# Patient Record
Sex: Female | Born: 1995 | Race: Black or African American | Hispanic: No | Marital: Single | Smoking: Current every day smoker
Health system: Southern US, Community
[De-identification: ages and names within clinical notes are randomized; demographics above are authoritative.]

## PROBLEM LIST (undated history)

## (undated) DIAGNOSIS — F32A Depression, unspecified: Secondary | ICD-10-CM

## (undated) DIAGNOSIS — R51 Headache: Secondary | ICD-10-CM

## (undated) DIAGNOSIS — T7840XA Allergy, unspecified, initial encounter: Secondary | ICD-10-CM

## (undated) DIAGNOSIS — H539 Unspecified visual disturbance: Secondary | ICD-10-CM

## (undated) DIAGNOSIS — J45909 Unspecified asthma, uncomplicated: Secondary | ICD-10-CM

## (undated) DIAGNOSIS — F4310 Post-traumatic stress disorder, unspecified: Secondary | ICD-10-CM

## (undated) DIAGNOSIS — F329 Major depressive disorder, single episode, unspecified: Secondary | ICD-10-CM

## (undated) DIAGNOSIS — R569 Unspecified convulsions: Secondary | ICD-10-CM

---
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
## 2016-01-10 ENCOUNTER — Emergency Department (HOSPITAL_BASED_OUTPATIENT_CLINIC_OR_DEPARTMENT_OTHER)
Admission: EM | Admit: 2016-01-10 | Discharge: 2016-01-10 | Disposition: A | Discharge status: Home / Self Care | Payer: Self-pay | Attending: Emergency Medicine | Admitting: Emergency Medicine

## 2016-01-10 ENCOUNTER — Emergency Department (HOSPITAL_BASED_OUTPATIENT_CLINIC_OR_DEPARTMENT_OTHER): Payer: Self-pay

## 2016-01-10 ENCOUNTER — Encounter (HOSPITAL_BASED_OUTPATIENT_CLINIC_OR_DEPARTMENT_OTHER): Payer: Self-pay | Admitting: Emergency Medicine

## 2016-01-10 DIAGNOSIS — K2970 Gastritis, unspecified, without bleeding: Secondary | ICD-10-CM | POA: Insufficient documentation

## 2016-01-10 DIAGNOSIS — R55 Syncope and collapse: Secondary | ICD-10-CM | POA: Insufficient documentation

## 2016-01-10 DIAGNOSIS — R42 Dizziness and giddiness: Secondary | ICD-10-CM | POA: Insufficient documentation

## 2016-01-10 DIAGNOSIS — M549 Dorsalgia, unspecified: Secondary | ICD-10-CM | POA: Insufficient documentation

## 2016-01-10 DIAGNOSIS — M542 Cervicalgia: Secondary | ICD-10-CM | POA: Insufficient documentation

## 2016-01-10 LAB — COMPREHENSIVE METABOLIC PANEL
ALT: 7 U/L — AB (ref 14–54)
AST: 13 U/L — AB (ref 15–41)
Albumin: 4.5 g/dL (ref 3.5–5.0)
Alkaline Phosphatase: 46 U/L (ref 38–126)
Anion gap: 9 (ref 5–15)
BUN: 14 mg/dL (ref 6–20)
CHLORIDE: 106 mmol/L (ref 101–111)
CO2: 23 mmol/L (ref 22–32)
Calcium: 9.2 mg/dL (ref 8.9–10.3)
Creatinine, Ser: 0.7 mg/dL (ref 0.44–1.00)
GFR calc non Af Amer: >60 mL/min (ref >60)
Glucose, Bld: 85 mg/dL (ref 65–99)
POTASSIUM: 3.6 mmol/L (ref 3.5–5.1)
Sodium: 138 mmol/L (ref 135–145)
Total Bilirubin: 0.6 mg/dL (ref 0.3–1.2)
Total Protein: 7.7 g/dL (ref 6.5–8.1)

## 2016-01-10 LAB — CBC WITH DIFFERENTIAL/PLATELET
Basophils Absolute: 0 10*3/uL (ref 0.0–0.1)
Basophils Relative: 0 %
EOS ABS: 0.1 10*3/uL (ref 0.0–0.7)
Eosinophils Relative: 1 %
HEMATOCRIT: 37.9 % (ref 36.0–46.0)
HEMOGLOBIN: 12.9 g/dL (ref 12.0–15.0)
LYMPHS ABS: 1.4 10*3/uL (ref 0.7–4.0)
LYMPHS PCT: 25 %
MCH: 31.2 pg (ref 26.0–34.0)
MCHC: 34 g/dL (ref 30.0–36.0)
MCV: 91.8 fL (ref 78.0–100.0)
MONOS PCT: 12 %
Monocytes Absolute: 0.7 10*3/uL (ref 0.1–1.0)
NEUTROS PCT: 62 %
Neutro Abs: 3.5 10*3/uL (ref 1.7–7.7)
Platelets: 201 10*3/uL (ref 150–400)
RBC: 4.13 MIL/uL (ref 3.87–5.11)
RDW: 13.3 % (ref 11.5–15.5)
WBC: 5.6 10*3/uL (ref 4.0–10.5)

## 2016-01-10 LAB — URINALYSIS, ROUTINE W REFLEX MICROSCOPIC
Glucose, UA: NEGATIVE mg/dL
Hgb urine dipstick: NEGATIVE
KETONES UR: NEGATIVE mg/dL
LEUKOCYTES UA: NEGATIVE
Nitrite: NEGATIVE
PROTEIN: 30 mg/dL — AB
Specific Gravity, Urine: 1.03 (ref 1.005–1.030)
pH: 6 (ref 5.0–8.0)

## 2016-01-10 LAB — URINE MICROSCOPIC-ADD ON

## 2016-01-10 LAB — PREGNANCY, URINE: PREG TEST UR: NEGATIVE

## 2016-01-10 MED ORDER — FAMOTIDINE IN NACL 20-0.9 MG/50ML-% IV SOLN
20.0000 mg | Freq: Once | INTRAVENOUS | Status: AC
  Administered 2016-01-10: 20 mg via INTRAVENOUS
  Filled 2016-01-10: qty 50

## 2016-01-10 MED ORDER — ONDANSETRON HCL 4 MG/2ML IJ SOLN
4.0000 mg | Freq: Once | INTRAMUSCULAR | Status: AC
  Administered 2016-01-10: 4 mg via INTRAVENOUS
  Filled 2016-01-10: qty 2

## 2016-01-10 MED ORDER — SODIUM CHLORIDE 0.9 % IV BOLUS (SEPSIS)
1000.0000 mL | Freq: Once | INTRAVENOUS | Status: AC
  Administered 2016-01-10: 1000 mL via INTRAVENOUS

## 2016-01-10 NOTE — ED Notes (Signed)
Patient reports she was brought to ER by brother.  States her friends told her they think she has alcohol poisoning and she can't remember anything.  States that she has vomited this morning.  States that she "blacked out" and states "I have been going in and out this morning".  When asked for clarification patient states she has been going in and out of consciousness.

## 2016-01-10 NOTE — Discharge Instructions (Signed)
Gastritis, Adult Gastritis is soreness and swelling (inflammation) of the lining of the stomach. Gastritis can develop as a sudden onset (acute) or long-term (chronic) condition. If gastritis is not treated, it can lead to stomach bleeding and ulcers. CAUSES  Gastritis occurs when the stomach lining is weak or damaged. Digestive juices from the stomach then inflame the weakened stomach lining. The stomach lining may be weak or damaged due to viral or bacterial infections. One common bacterial infection is the Helicobacter pylori infection. Gastritis can also result from excessive alcohol consumption, taking certain medicines, or having too much acid in the stomach.  SYMPTOMS  In some cases, there are no symptoms. When symptoms are present, they may include:  Pain or a burning sensation in the upper abdomen.  Nausea.  Vomiting.  An uncomfortable feeling of fullness after eating. DIAGNOSIS  Your caregiver may suspect you have gastritis based on your symptoms and a physical exam. To determine the cause of your gastritis, your caregiver may perform the following:  Blood or stool tests to check for the H pylori bacterium.  Gastroscopy. A thin, flexible tube (endoscope) is passed down the esophagus and into the stomach. The endoscope has a light and camera on the end. Your caregiver uses the endoscope to view the inside of the stomach.  Taking a tissue sample (biopsy) from the stomach to examine under a microscope. TREATMENT  Depending on the cause of your gastritis, medicines may be prescribed. If you have a bacterial infection, such as an H pylori infection, antibiotics may be given. If your gastritis is caused by too much acid in the stomach, H2 blockers or antacids may be given. Your caregiver may recommend that you stop taking aspirin, ibuprofen, or other nonsteroidal anti-inflammatory drugs (NSAIDs). HOME CARE INSTRUCTIONS  Only take over-the-counter or prescription medicines as directed by  your caregiver.  If you were given antibiotic medicines, take them as directed. Finish them even if you start to feel better.  Drink enough fluids to keep your urine clear or pale yellow.  Avoid foods and drinks that make your symptoms worse, such as:  Caffeine or alcoholic drinks.  Chocolate.  Peppermint or mint flavorings.  Garlic and onions.  Spicy foods.  Citrus fruits, such as oranges, lemons, or limes.  Tomato-based foods such as sauce, chili, salsa, and pizza.  Fried and fatty foods.  Eat small, frequent meals instead of large meals. SEEK IMMEDIATE MEDICAL CARE IF:   You have black or dark red stools.  You vomit blood or material that looks like coffee grounds.  You are unable to keep fluids down.  Your abdominal pain gets worse.  You have a fever.  You do not feel better after 1 week.  You have any other questions or concerns. MAKE SURE YOU:  Understand these instructions.  Will watch your condition.  Will get help right away if you are not doing well or get worse.   This information is not intended to replace advice given to you by your health care provider. Make sure you discuss any questions you have with your health care provider.   Document Released: 06/28/2001 Document Revised: 01/03/2012 Document Reviewed: 08/17/2011 Elsevier Interactive Patient Education 2016 [employer]  Syncope Syncope is a medical term for fainting or passing out. This means you lose consciousness and drop to the ground. People are generally unconscious for less than 5 minutes. You may have some muscle twitches for up to 15 seconds before waking up and returning to normal. Syncope  occurs more often in older adults, but it can happen to anyone. While most causes of syncope are not dangerous, syncope can be a sign of a serious medical problem. It is important to seek medical care.  CAUSES  Syncope is caused by a sudden drop in blood flow to the brain. The specific cause is  often not determined. Factors that can bring on syncope include:  Taking medicines that lower blood pressure.  Sudden changes in posture, such as standing up quickly.  Taking more medicine than prescribed.  Standing in one place for too long.  Seizure disorders.  Dehydration and excessive exposure to heat.  Low blood sugar (hypoglycemia).  Straining to have a bowel movement.  Heart disease, irregular heartbeat, or other circulatory problems.  Fear, emotional distress, seeing blood, or severe pain. SYMPTOMS  Right before fainting, you may:  Feel dizzy or light-headed.  Feel nauseous.  See all white or all black in your field of vision.  Have cold, clammy skin. DIAGNOSIS  Your health care provider will ask about your symptoms, perform a physical exam, and perform an electrocardiogram (ECG) to record the electrical activity of your heart. Your health care provider may also perform other heart or blood tests to determine the cause of your syncope which may include:  Transthoracic echocardiogram (TTE). During echocardiography, sound waves are used to evaluate how blood flows through your heart.  Transesophageal echocardiogram (TEE).  Cardiac monitoring. This allows your health care provider to monitor your heart rate and rhythm in real time.  Holter monitor. This is a portable device that records your heartbeat and can help diagnose heart arrhythmias. It allows your health care provider to track your heart activity for several days, if needed.  Stress tests by exercise or by giving medicine that makes the heart beat faster. TREATMENT  In most cases, no treatment is needed. Depending on the cause of your syncope, your health care provider may recommend changing or stopping some of your medicines. HOME CARE INSTRUCTIONS  Have someone stay with you until you feel stable.  Do not drive, use machinery, or play sports until your health care provider says it is okay.  Keep all  follow-up appointments as directed by your health care provider.  Lie down right away if you start feeling like you might faint. Breathe deeply and steadily. Wait until all the symptoms have passed.  Drink enough fluids to keep your urine clear or pale yellow.  If you are taking blood pressure or heart medicine, get up slowly and take several minutes to sit and then stand. This can reduce dizziness. SEEK IMMEDIATE MEDICAL CARE IF:   You have a severe headache.  You have unusual pain in the chest, abdomen, or back.  You are bleeding from your mouth or rectum, or you have black or tarry stool.  You have an irregular or very fast heartbeat.  You have pain with breathing.  You have repeated fainting or seizure-like jerking during an episode.  You faint when sitting or lying down.  You have confusion.  You have trouble walking.  You have severe weakness.  You have vision problems. If you fainted, call your local emergency services (911 in [place] Do not drive yourself to the hospital.    This information is not intended to replace advice given to you by your health care provider. Make sure you discuss any questions you have with your health care provider.   Document Released: 07/04/2005 Document Revised: 11/18/2014 Document Reviewed: 09/02/2011 Elsevier  Interactive Patient Education ©2016 Elsevier Inc. ° °

## 2016-01-10 NOTE — ED Notes (Signed)
Pt given gingerale to drink, states she is feeling better.

## 2016-01-10 NOTE — ED Provider Notes (Signed)
CSN: [id number]     Arrival date & time 01/10/16  1148 History   First MD Initiated Contact with Patient 01/10/16 1225     Chief Complaint  Patient presents with  . Emesis     (Consider location/radiation/quality/duration/timing/severity/associated sxs/prior Treatment) HPI Comments: Patient presents with nausea vomiting and syncope. She states that she drink a lot of alcohol last night. She can't remember how much she drink. She states that she's had ongoing vomiting this morning and pain across her abdomen. She states it's sore all over. She states she had a syncope episode earlier this morning. She states she fell and currently has pain in her head and neck as well as her left back. She doesn't know how she fell. She denies any numbness or weakness to her extremities. No other recent illnesses. No fevers. She does have a bad headache. She denies any other injuries from the fall.  Patient is a 20 y.o. female presenting with vomiting.  Emesis Associated symptoms: abdominal pain and headaches   Associated symptoms: no arthralgias, no chills and no diarrhea     Past Medical History  Diagnosis Date  . Vision abnormalities   . Allergy   . [id number](478.2)    Past Surgical History  Procedure Laterality Date  . Breast cyst excision  August 2012   Family History  Problem Relation Age of Onset  . Alcohol abuse Father   . Sudden death Neg Hx   . Hypertension Neg Hx   . Hyperlipidemia Neg Hx   . Heart attack Neg Hx   . Diabetes Neg Hx    Social History  Substance Use Topics  . Smoking status: Never Smoker   . Smokeless tobacco: None  . Alcohol Use: Yes   OB History    No data available     Review of Systems  Constitutional: Positive for fatigue. Negative for fever, chills and diaphoresis.  HENT: Negative for congestion, rhinorrhea and sneezing.   Eyes: Negative.   Respiratory: Negative for cough, chest tightness and shortness of breath.   Cardiovascular: Negative for chest  pain and leg swelling.  Gastrointestinal: Positive for nausea, vomiting and abdominal pain. Negative for diarrhea and blood in stool.  Genitourinary: Negative for frequency, hematuria, flank pain and difficulty urinating.  Musculoskeletal: Positive for back pain and neck pain. Negative for arthralgias.  Skin: Negative for rash.  Neurological: Positive for syncope, light-headedness and headaches. Negative for dizziness, speech difficulty, weakness and numbness.      Allergies  Pollen extract  Home Medications   Prior to Admission medications   Medication Sig Start Date End Date Taking? Authorizing Provider  ondansetron (ZOFRAN) 4 MG tablet Take 1 tablet (4 mg total) by mouth every 6 (six) hours. 09/23/15   Hanna Patel-Mills, PA-C  oxyCODONE-acetaminophen (PERCOCET/ROXICET) 5-325 MG tablet Take 2 tablets by mouth every 4 (four) hours as needed for severe pain. 09/23/15   Hanna Patel-Mills, PA-C   BP 119/80 mmHg  Pulse 75  Temp(Src) 98.5 F (36.9 C) (Oral)  Resp 18  Ht 5\' 9"  (1.753 m)  Wt 137 lb (62.143 kg)  BMI 20.22 kg/m2  SpO2 100%  LMP 12/20/2015 Physical Exam  Constitutional: She is oriented to person, place, and time. She appears well-developed and well-nourished.  HENT:  Head: Normocephalic and atraumatic.  Eyes: Pupils are equal, round, and reactive to light.  Neck:  Positive tenderness throughout the cervical spine. There is no pain to the thoracic or lumbosacral spine. There is tenderness to the left lumbar  musculature. No evidence of external trauma is noted.  Cardiovascular: Normal rate, regular rhythm and normal heart sounds.   Pulmonary/Chest: Effort normal and breath sounds normal. No respiratory distress. She has no wheezes. She has no rales. She exhibits no tenderness.  Abdominal: Soft. Bowel sounds are normal. There is no tenderness. There is no rebound and no guarding.  Musculoskeletal: Normal range of motion. She exhibits no edema.  No pain on palpation or range  of motion extremities  Lymphadenopathy:    She has no cervical adenopathy.  Neurological: She is alert and oriented to person, place, and time. She has normal strength. No cranial nerve deficit or sensory deficit. GCS eye subscore is 4. GCS verbal subscore is 5. GCS motor subscore is 6.  Skin: Skin is warm and dry. No rash noted.  Psychiatric: She has a normal mood and affect.    ED Course  Procedures (including critical care time) Labs Review Results for orders placed or performed during the hospital encounter of 01/10/16  Pregnancy, urine  Result Value Ref Range   Preg Test, Ur NEGATIVE NEGATIVE  Urinalysis, Routine w reflex microscopic (not at [facility])  Result Value Ref Range   Color, Urine AMBER (A) YELLOW   APPearance CLOUDY (A) CLEAR   Specific Gravity, Urine 1.030 1.005 - 1.030   pH 6.0 5.0 - 8.0   Glucose, UA NEGATIVE NEGATIVE mg/dL   Hgb urine dipstick NEGATIVE NEGATIVE   Bilirubin Urine SMALL (A) NEGATIVE   Ketones, ur NEGATIVE NEGATIVE mg/dL   Protein, ur 30 (A) NEGATIVE mg/dL   Nitrite NEGATIVE NEGATIVE   Leukocytes, UA NEGATIVE NEGATIVE  Comprehensive metabolic panel  Result Value Ref Range   Sodium 138 135 - 145 mmol/L   Potassium 3.6 3.5 - 5.1 mmol/L   Chloride 106 101 - 111 mmol/L   CO2 23 22 - 32 mmol/L   Glucose, Bld 85 65 - 99 mg/dL   BUN 14 6 - 20 mg/dL   Creatinine, Ser 1.61 0.44 - 1.00 mg/dL   Calcium 9.2 8.9 - 09.6 mg/dL   Total Protein 7.7 6.5 - 8.1 g/dL   Albumin 4.5 3.5 - 5.0 g/dL   AST 13 (L) 15 - 41 U/L   ALT 7 (L) 14 - 54 U/L   Alkaline Phosphatase 46 38 - 126 U/L   Total Bilirubin 0.6 0.3 - 1.2 mg/dL   GFR calc non Af Amer >60 >60 mL/min   GFR calc Af Amer >60 >60 mL/min   Anion gap 9 5 - 15  CBC with Differential  Result Value Ref Range   WBC 5.6 4.0 - 10.5 K/uL   RBC 4.13 3.87 - 5.11 MIL/uL   Hemoglobin 12.9 12.0 - 15.0 g/dL   HCT 04.5 40.9 - 81.1 %   MCV 91.8 78.0 - 100.0 fL   MCH 31.2 26.0 - 34.0 pg   MCHC 34.0 30.0 - 36.0 g/dL    RDW 91.4 78.2 - 95.6 %   Platelets 201 150 - 400 K/uL   Neutrophils Relative % 62 %   Neutro Abs 3.5 1.7 - 7.7 K/uL   Lymphocytes Relative 25 %   Lymphs Abs 1.4 0.7 - 4.0 K/uL   Monocytes Relative 12 %   Monocytes Absolute 0.7 0.1 - 1.0 K/uL   Eosinophils Relative 1 %   Eosinophils Absolute 0.1 0.0 - 0.7 K/uL   Basophils Relative 0 %   Basophils Absolute 0.0 0.0 - 0.1 K/uL  Urine microscopic-add on  Result Value Ref Range  Squamous Epithelial / LPF 6-30 (A) NONE SEEN   WBC, UA 0-5 0 - 5 WBC/hpf   RBC / HPF 0-5 0 - 5 RBC/hpf   Bacteria, UA MANY (A) NONE SEEN   Urine-Other MUCOUS PRESENT    Ct Head Wo Contrast  01/10/2016  CLINICAL DATA:  Syncopal episode today. Fall. Headache and neck pain. Initial encounter. EXAM: CT HEAD WITHOUT CONTRAST CT CERVICAL SPINE WITHOUT CONTRAST TECHNIQUE: Multidetector CT imaging of the head and cervical spine was performed following the standard protocol without intravenous contrast. Multiplanar CT image reconstructions of the cervical spine were also generated. COMPARISON:  None. FINDINGS: CT HEAD FINDINGS The ventricles and sulci are within normal limits for age. There is no evidence of acute infarct, intracranial hemorrhage, mass, midline shift, or extra-axial collection. The orbits are unremarkable. The visualized paranasal sinuses and mastoid air cells are clear. No skull fracture is identified. CT CERVICAL SPINE FINDINGS Vertebral alignment is normal. Vertebral body heights and intervertebral disc space heights are preserved. No cervical spine fracture is identified. The visualized lung apices are clear. Paraspinal soft tissues are unremarkable. There is a small amounts of venous gas in the right supraclavicular region which is likely related to recent venipuncture. IMPRESSION: Negative head and cervical spine CTs. Electronically Signed   By: Sebastian AcheAllen  Grady M.D.   On: 01/10/2016 14:02   Ct Cervical Spine Wo Contrast  01/10/2016  CLINICAL DATA:  Syncopal  episode today. Fall. Headache and neck pain. Initial encounter. EXAM: CT HEAD WITHOUT CONTRAST CT CERVICAL SPINE WITHOUT CONTRAST TECHNIQUE: Multidetector CT imaging of the head and cervical spine was performed following the standard protocol without intravenous contrast. Multiplanar CT image reconstructions of the cervical spine were also generated. COMPARISON:  None. FINDINGS: CT HEAD FINDINGS The ventricles and sulci are within normal limits for age. There is no evidence of acute infarct, intracranial hemorrhage, mass, midline shift, or extra-axial collection. The orbits are unremarkable. The visualized paranasal sinuses and mastoid air cells are clear. No skull fracture is identified. CT CERVICAL SPINE FINDINGS Vertebral alignment is normal. Vertebral body heights and intervertebral disc space heights are preserved. No cervical spine fracture is identified. The visualized lung apices are clear. Paraspinal soft tissues are unremarkable. There is a small amounts of venous gas in the right supraclavicular region which is likely related to recent venipuncture. IMPRESSION: Negative head and cervical spine CTs. Electronically Signed   By: Sebastian AcheAllen  Grady M.D.   On: 01/10/2016 14:02      Imaging Review Ct Head Wo Contrast  01/10/2016  CLINICAL DATA:  Syncopal episode today. Fall. Headache and neck pain. Initial encounter. EXAM: CT HEAD WITHOUT CONTRAST CT CERVICAL SPINE WITHOUT CONTRAST TECHNIQUE: Multidetector CT imaging of the head and cervical spine was performed following the standard protocol without intravenous contrast. Multiplanar CT image reconstructions of the cervical spine were also generated. COMPARISON:  None. FINDINGS: CT HEAD FINDINGS The ventricles and sulci are within normal limits for age. There is no evidence of acute infarct, intracranial hemorrhage, mass, midline shift, or extra-axial collection. The orbits are unremarkable. The visualized paranasal sinuses and mastoid air cells are clear. No  skull fracture is identified. CT CERVICAL SPINE FINDINGS Vertebral alignment is normal. Vertebral body heights and intervertebral disc space heights are preserved. No cervical spine fracture is identified. The visualized lung apices are clear. Paraspinal soft tissues are unremarkable. There is a small amounts of venous gas in the right supraclavicular region which is likely related to recent venipuncture. IMPRESSION: Negative head and cervical  spine CTs. Electronically Signed   By: Sebastian AcheAllen  Grady M.D.   On: 01/10/2016 14:02   Ct Cervical Spine Wo Contrast  01/10/2016  CLINICAL DATA:  Syncopal episode today. Fall. Headache and neck pain. Initial encounter. EXAM: CT HEAD WITHOUT CONTRAST CT CERVICAL SPINE WITHOUT CONTRAST TECHNIQUE: Multidetector CT imaging of the head and cervical spine was performed following the standard protocol without intravenous contrast. Multiplanar CT image reconstructions of the cervical spine were also generated. COMPARISON:  None. FINDINGS: CT HEAD FINDINGS The ventricles and sulci are within normal limits for age. There is no evidence of acute infarct, intracranial hemorrhage, mass, midline shift, or extra-axial collection. The orbits are unremarkable. The visualized paranasal sinuses and mastoid air cells are clear. No skull fracture is identified. CT CERVICAL SPINE FINDINGS Vertebral alignment is normal. Vertebral body heights and intervertebral disc space heights are preserved. No cervical spine fracture is identified. The visualized lung apices are clear. Paraspinal soft tissues are unremarkable. There is a small amounts of venous gas in the right supraclavicular region which is likely related to recent venipuncture. IMPRESSION: Negative head and cervical spine CTs. Electronically Signed   By: Sebastian AcheAllen  Grady M.D.   On: 01/10/2016 14:02   I have personally reviewed and evaluated these images and lab results as part of my medical decision-making.   EKG Interpretation None       MDM   Final diagnoses:  Gastritis  Syncope, unspecified syncope type    Patient presents after syncopal episode. She has no evidence of head trauma or cervical spine injury. She's neurologically intact. There is no reported seizures. She was given IV fluids and antibiotics. She's feeling much better after this. She's tolerating by mouth fluids. She's able to ambulate without dizziness. I feel her symptoms are likely related to the alcohol use last night. She was discharged home in good condition. She was advised to refrain from alcohol use and return here as needed for any worsening symptoms.    Rolan BuccoMelanie Jonda Alanis, MD 01/10/16 [phone]

## 2016-01-10 NOTE — ED Notes (Signed)
Pt given d/c instructions as per chart. Verbalizes understanding. No questions. 

## 2016-01-10 NOTE — ED Notes (Signed)
Patient reports that she is having vomiting and abdominal pain since last night.

## 2016-03-12 ENCOUNTER — Emergency Department (HOSPITAL_BASED_OUTPATIENT_CLINIC_OR_DEPARTMENT_OTHER): Payer: Self-pay

## 2016-03-12 ENCOUNTER — Encounter (HOSPITAL_BASED_OUTPATIENT_CLINIC_OR_DEPARTMENT_OTHER): Payer: Self-pay | Admitting: *Deleted

## 2016-03-12 ENCOUNTER — Emergency Department (HOSPITAL_BASED_OUTPATIENT_CLINIC_OR_DEPARTMENT_OTHER)
Admission: EM | Admit: 2016-03-12 | Discharge: 2016-03-12 | Disposition: A | Discharge status: Home / Self Care | Payer: Self-pay | Attending: Emergency Medicine | Admitting: Emergency Medicine

## 2016-03-12 DIAGNOSIS — Y929 Unspecified place or not applicable: Secondary | ICD-10-CM | POA: Insufficient documentation

## 2016-03-12 DIAGNOSIS — Y999 Unspecified external cause status: Secondary | ICD-10-CM | POA: Insufficient documentation

## 2016-03-12 DIAGNOSIS — W2201XA Walked into wall, initial encounter: Secondary | ICD-10-CM | POA: Insufficient documentation

## 2016-03-12 DIAGNOSIS — S60222A Contusion of left hand, initial encounter: Secondary | ICD-10-CM | POA: Insufficient documentation

## 2016-03-12 DIAGNOSIS — Y939 Activity, unspecified: Secondary | ICD-10-CM | POA: Insufficient documentation
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
## 2016-03-12 NOTE — Discharge Instructions (Signed)
You can take ibuprofen, available over the counter, as needed for pain.
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
## 2016-05-29 ENCOUNTER — Emergency Department (HOSPITAL_COMMUNITY): Payer: Self-pay

## 2016-05-29 ENCOUNTER — Emergency Department (HOSPITAL_COMMUNITY)
Admission: EM | Admit: 2016-05-29 | Discharge: 2016-05-29 | Disposition: A | Discharge status: Home / Self Care | Payer: Self-pay | Attending: Emergency Medicine | Admitting: Emergency Medicine

## 2016-05-29 ENCOUNTER — Encounter (HOSPITAL_COMMUNITY): Payer: Self-pay | Admitting: Emergency Medicine

## 2016-05-29 DIAGNOSIS — R740 Nonspecific elevation of levels of transaminase and lactic acid dehydrogenase [LDH]: Secondary | ICD-10-CM | POA: Insufficient documentation

## 2016-05-29 DIAGNOSIS — R569 Unspecified convulsions: Secondary | ICD-10-CM | POA: Insufficient documentation

## 2016-05-29 DIAGNOSIS — F10120 Alcohol abuse with intoxication, uncomplicated: Secondary | ICD-10-CM | POA: Insufficient documentation

## 2016-05-29 DIAGNOSIS — F10920 Alcohol use, unspecified with intoxication, uncomplicated: Secondary | ICD-10-CM

## 2016-05-29 DIAGNOSIS — R42 Dizziness and giddiness: Secondary | ICD-10-CM | POA: Insufficient documentation

## 2016-05-29 DIAGNOSIS — R7989 Other specified abnormal findings of blood chemistry: Secondary | ICD-10-CM

## 2016-05-29 HISTORY — DX: Unspecified asthma, uncomplicated: J45.909

## 2016-05-29 LAB — COMPREHENSIVE METABOLIC PANEL
ALBUMIN: 4.2 g/dL (ref 3.5–5.0)
ALT: 9 U/L — ABNORMAL LOW (ref 14–54)
ANION GAP: 14 (ref 5–15)
AST: 22 U/L (ref 15–41)
Alkaline Phosphatase: 39 U/L (ref 38–126)
BILIRUBIN TOTAL: 0.5 mg/dL (ref 0.3–1.2)
BUN: 12 mg/dL (ref 6–20)
CALCIUM: 8.9 mg/dL (ref 8.9–10.3)
CO2: 17 mmol/L — AB (ref 22–32)
Chloride: 106 mmol/L (ref 101–111)
Creatinine, Ser: 0.99 mg/dL (ref 0.44–1.00)
GFR calc non Af Amer: >60 mL/min (ref >60)
GLUCOSE: 109 mg/dL — AB (ref 65–99)
POTASSIUM: 3.1 mmol/L — AB (ref 3.5–5.1)
SODIUM: 137 mmol/L (ref 135–145)
TOTAL PROTEIN: 7.1 g/dL (ref 6.5–8.1)

## 2016-05-29 LAB — CBC WITH DIFFERENTIAL/PLATELET
BASOS PCT: 0 %
Basophils Absolute: 0 10*3/uL (ref 0.0–0.1)
EOS ABS: 0 10*3/uL (ref 0.0–0.7)
Eosinophils Relative: 1 %
HEMATOCRIT: 37 % (ref 36.0–46.0)
HEMOGLOBIN: 12.2 g/dL (ref 12.0–15.0)
Lymphocytes Relative: 24 %
Lymphs Abs: 1.4 10*3/uL (ref 0.7–4.0)
MCH: 30.9 pg (ref 26.0–34.0)
MCHC: 33 g/dL (ref 30.0–36.0)
MCV: 93.7 fL (ref 78.0–100.0)
MONOS PCT: 9 %
Monocytes Absolute: 0.5 10*3/uL (ref 0.1–1.0)
NEUTROS ABS: 3.9 10*3/uL (ref 1.7–7.7)
NEUTROS PCT: 66 %
Platelets: 222 10*3/uL (ref 150–400)
RBC: 3.95 MIL/uL (ref 3.87–5.11)
RDW: 14 % (ref 11.5–15.5)
WBC: 5.9 10*3/uL (ref 4.0–10.5)

## 2016-05-29 LAB — RAPID URINE DRUG SCREEN, HOSP PERFORMED
AMPHETAMINES: NOT DETECTED
BENZODIAZEPINES: NOT DETECTED
Barbiturates: NOT DETECTED
COCAINE: NOT DETECTED
OPIATES: NOT DETECTED
Tetrahydrocannabinol: NOT DETECTED

## 2016-05-29 LAB — I-STAT CG4 LACTIC ACID, ED
LACTIC ACID, VENOUS: 3.57 mmol/L — AB (ref 0.5–1.9)
LACTIC ACID, VENOUS: 2.24 mmol/L — AB (ref 0.5–1.9)
Lactic Acid, Venous: 8.04 mmol/L (ref 0.5–1.9)

## 2016-05-29 LAB — I-STAT BETA HCG BLOOD, ED (MC, WL, AP ONLY)

## 2016-05-29 LAB — CK: Total CK: 115 U/L (ref 38–234)

## 2016-05-29 LAB — ETHANOL: Alcohol, Ethyl (B): 106 mg/dL — ABNORMAL HIGH (ref <5)

## 2016-05-29 MED ORDER — SODIUM CHLORIDE 0.9 % IV BOLUS (SEPSIS)
1000.0000 mL | Freq: Once | INTRAVENOUS | Status: AC
  Administered 2016-05-29: 1000 mL via INTRAVENOUS

## 2016-05-29 MED ORDER — LEVETIRACETAM 500 MG/5ML IV SOLN
1000.0000 mg | Freq: Once | INTRAVENOUS | Status: AC
  Administered 2016-05-29: 1000 mg via INTRAVENOUS
  Filled 2016-05-29 (×2): qty 10

## 2016-05-29 MED ORDER — LEVETIRACETAM 500 MG PO TABS: 500.0000 mg | ORAL_TABLET | Freq: Two times a day (BID) | ORAL | 0 refills | Status: DC

## 2016-05-29 NOTE — ED Notes (Signed)
Patient transported to X-ray 
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
## 2016-05-29 NOTE — ED Notes (Signed)
Pt. returned from XR. 

## 2016-05-29 NOTE — Discharge Instructions (Signed)
Please make a follow-up appointment with one of the neurologists. They will need to evaluate you and arrange for an EEG (brainwave study. Please be advised that the law [place] states that you are not allowed to drive a car until you have been seizure-free for 6 months.

## 2016-05-29 NOTE — ED Provider Notes (Signed)
[facility]-EMERGENCY DEPT Provider Note   CSN: [id number] Arrival date & time: 05/29/16  0128   By signing my name below, I, Michele Jenkins, attest that this documentation has been prepared under the direction and in the presence of Michele Boozeavid Davetta Olliff, MD. Electronically Signed: Morene CrockerKevin Jenkins, Scribe. 05/29/16. 1:47 AM.   History   Chief Complaint No chief complaint on file.  The history is provided by the patient. No language interpreter was used.   Level 5 Caveat Altered Mental Status   HPI Comments: Michele Jenkins is a 20 y.o. female who presents to the Emergency Department by EMS for alcohol intoxication. Per EMS, she was found outside at a bar on the ground and her friends stated patient was dizzy, mumbling, and foaming at the mouth. At that time, no seizure like activity reported. Per EMS, she has no known history of seizures. As of now, patient states her head hurts.   Past Medical History:  Diagnosis Date  . Allergy   . Headache(784.0)   . Vision abnormalities     Patient Active Problem List   Diagnosis Date Noted  . Right knee injury 06/25/2013  . Oppositional defiant disorder 04/09/2012  . PTSD (post-traumatic stress disorder) 04/07/2012  . MDD (major depressive disorder), single episode, severe (HCC) 04/07/2012    Past Surgical History:  Procedure Laterality Date  . BREAST CYST EXCISION  August 2012    OB History    No data available       Home Medications    Prior to Admission medications   Medication Sig Start Date End Date Taking? Authorizing Provider  ondansetron (ZOFRAN) 4 MG tablet Take 1 tablet (4 mg total) by mouth every 6 (six) hours. 09/23/15   Hanna Patel-Mills, PA-C  oxyCODONE-acetaminophen (PERCOCET/ROXICET) 5-325 MG tablet Take 2 tablets by mouth every 4 (four) hours as needed for severe pain. 09/23/15   Catha GosselinHanna Patel-Mills, PA-C    Family History Family History  Problem Relation Age of Onset  . Alcohol abuse Father   . Sudden death Neg Hx   . Hypertension Neg Hx     . Hyperlipidemia Neg Hx   . Heart attack Neg Hx   . Diabetes Neg Hx     Social History Social History  Substance Use Topics  . Smoking status: Never Smoker  . Smokeless tobacco: Never Used  . Alcohol use Yes     Allergies   Pollen extract   Review of Systems Review of Systems  Unable to perform ROS: Mental status change     Physical Exam Updated Vital Signs BP 128/89 (BP Location: Left Arm)   Pulse 104   Temp 98.6 F (37 C) (Oral)   Ht 5\' 5"  (1.651 m)   Wt 140 lb (63.5 kg)   SpO2 100%   BMI 23.30 kg/m   Physical Exam  Constitutional: She appears well-developed and well-nourished.  HENT:  Head: Normocephalic and atraumatic.  Eyes: EOM are normal. Pupils are equal, round, and reactive to light.  Neck: Normal range of motion. Neck supple. No JVD present.  Cardiovascular: Normal rate, regular rhythm and normal heart sounds.   No murmur heard. Pulmonary/Chest: Effort normal and breath sounds normal. She has no wheezes. She has no rales. She exhibits no tenderness.  Abdominal: Soft. Bowel sounds are normal. She exhibits no distension and no mass. There is no tenderness.  Musculoskeletal: Normal range of motion. She exhibits no edema.  Lymphadenopathy:    She has no cervical adenopathy.  Neurological: She is alert. No  cranial nerve deficit or sensory deficit. She exhibits normal muscle tone. Coordination normal.  Lethargic, but arousable  Slow to answer questions, but answers appropriately No focal, motor, or sensory deficits  Skin: Skin is warm and dry. No rash noted.  Psychiatric: She has a normal mood and affect. Her behavior is normal. Judgment and thought content normal.  Nursing note and vitals reviewed.    ED Treatments / Results  DIAGNOSTIC STUDIES: Oxygen Saturation is 100% on RA, normal by my interpretation.    COORDINATION OF CARE: 1:43 AM Discussed treatment plan with pt at bedside and pt agreed to plan.  Labs (all labs ordered are listed, but  only abnormal results are displayed) Labs Reviewed  COMPREHENSIVE METABOLIC PANEL - Abnormal; Notable for the following:       Result Value   Potassium 3.1 (*)    CO2 17 (*)    Glucose, Bld 109 (*)    ALT 9 (*)    All other components within normal limits  ETHANOL - Abnormal; Notable for the following:    Alcohol, Ethyl (B) 106 (*)    All other components within normal limits  I-STAT CG4 LACTIC ACID, ED - Abnormal; Notable for the following:    Lactic Acid, Venous 8.04 (*)    All other components within normal limits  I-STAT CG4 LACTIC ACID, ED - Abnormal; Notable for the following:    Lactic Acid, Venous 3.57 (*)    All other components within normal limits  I-STAT CG4 LACTIC ACID, ED - Abnormal; Notable for the following:    Lactic Acid, Venous 2.24 (*)    All other components within normal limits  CBC WITH DIFFERENTIAL/PLATELET  RAPID URINE DRUG SCREEN, HOSP PERFORMED  CK  I-STAT BETA HCG BLOOD, ED ([facility], [facility], [facility] ONLY)    EKG  EKG Interpretation  Date/Time:  Sunday May 29 2016 01:35:09 EST Ventricular Rate:  103 PR Interval:    QRS Duration: 86 QT Interval:  321 QTC Calculation: 421 R Axis:   83 Text Interpretation:  Sinus tachycardia RSR' in V1 or V2, probably normal variant Borderline T wave abnormalities Baseline wander in lead(s) I III aVL V1 V3 V4 V5 V6 No old tracing to compare Confirmed by [facility]  MD, Jamielyn Petrucci ([id number]) on 05/29/2016 1:42:24 AM       Radiology Dg Cervical Spine Complete  Result Date: 05/29/2016 CLINICAL DATA:  Altered mental status. EXAM: CERVICAL SPINE - COMPLETE 4+ VIEW COMPARISON:  CT cervical spine 01/10/2016 FINDINGS: There is no evidence of cervical spine fracture or prevertebral soft tissue swelling. Alignment is normal. No other significant bone abnormalities are identified. IMPRESSION: Negative cervical spine radiographs. Electronically Signed   By: Burman NievesWilliam  Stevens M.D.   On: 05/29/2016 02:59   Ct Head Wo Contrast  Result Date:  05/29/2016 CLINICAL DATA:  Altered mental status. EXAM: CT HEAD WITHOUT CONTRAST TECHNIQUE: Contiguous axial images were obtained from the base of the skull through the vertex without intravenous contrast. COMPARISON:  01/10/2016 FINDINGS: Brain: No evidence of acute infarction, hemorrhage, hydrocephalus, extra-axial collection or mass lesion/mass effect. Vascular: No hyperdense vessel or unexpected calcification. Skull: Normal. Negative for fracture or focal lesion. Sinuses/Orbits: No acute finding. Other: No significant change since prior studies. IMPRESSION: No acute intracranial abnormalities. Electronically Signed   By: Burman NievesWilliam  Stevens M.D.   On: 05/29/2016 02:28   Dg Hand Complete Left  Result Date: 05/29/2016 CLINICAL DATA:  Left hand pain.  Syncope. EXAM: LEFT HAND - COMPLETE 3+ VIEW COMPARISON:  03/12/2016  FINDINGS: There is no evidence of fracture or dislocation. There is no evidence of arthropathy or other focal bone abnormality. Soft tissues are unremarkable. IMPRESSION: Negative. Electronically Signed   By: Burman Nieves M.D.   On: 05/29/2016 05:23    Procedures Procedures (including critical care time)  Medications Ordered in ED Medications - No data to display   Initial Impression / Assessment and Plan / ED Course  I have reviewed the triage vital signs and the nursing notes.  Pertinent labs & imaging results that were available during my care of the patient were reviewed by me and considered in my medical decision making (see chart for details).  Clinical Course    Altered mental status with patient noted to be of foaming at the mouth intermittently. She apparently had been drinking but does not have a strong odor of ethanol on her breath. Suspect possible seizure. She is sent for CT of head and screening labs obtained. Will include CK and lactic acid with screening labs.  CT of head has come back unremarkable. Lactic acid level is significantly elevated which would be  consistent with seizure. She's given IV fluids. Metabolic panel shows metabolic acidosis and hypokalemia. These can also be seen in the immediate aftermath of seizure. CK is normal which would argue against a seizure. Ethanol level is elevated to 106. This is legally intoxicated, but not high enough to account for her altered mentation.  3:16 AM Patient reevaluated and she is much more awake and alert and conversant. She is complaining of pain in her left hand in the thenar eminence. There is no obvious swelling or deformity, but will send for x-ray. Will repeat lactic acid level to see if it has normalized.  4:05 AM She was observed to have seizure-like activity, but not grand mal. Will give loading dose of levetiracetam.  8:50 AM She has had no further seizures since getting her levetiracetam. Lactic acid level is normalizing. She is sleeping but easily awakened and she has normal mental status when awakened. She is referred to neurology for follow-up. She is given a prescription for levetiracetam. She is advised of loss regarding seizures and driving.  Final Clinical Impressions(s) / ED Diagnoses   Final diagnoses:  Seizure (HCC)  Alcohol intoxication, uncomplicated (HCC)  Elevated lactic acid level    New Prescriptions New Prescriptions   LEVETIRACETAM (KEPPRA) 500 MG TABLET    Take 1 tablet (500 mg total) by mouth 2 (two) times daily.   I personally performed the services described in this documentation, which was scribed in my presence. The recorded information has been reviewed and is accurate.       Michele Booze, MD 05/29/16 [phone]

## 2016-08-01 ENCOUNTER — Inpatient Hospital Stay (HOSPITAL_COMMUNITY)
Admission: AD | Admit: 2016-08-01 | Discharge: 2016-08-01 | Disposition: A | Discharge status: Home / Self Care | Payer: Self-pay | Source: Ambulatory Visit | Attending: Family Medicine | Admitting: Family Medicine

## 2016-08-01 DIAGNOSIS — F17210 Nicotine dependence, cigarettes, uncomplicated: Secondary | ICD-10-CM | POA: Insufficient documentation

## 2016-08-01 DIAGNOSIS — N6320 Unspecified lump in the left breast, unspecified quadrant: Secondary | ICD-10-CM | POA: Insufficient documentation

## 2016-08-01 LAB — POCT PREGNANCY, URINE: Preg Test, Ur: NEGATIVE

## 2016-08-01 NOTE — Discharge Instructions (Signed)
Fibrocystic Breast Changes  Fibrocystic breast changes are changes that can make your breasts swollen or painful. These changes happen when tiny sacs of fluid (cysts) form in the breast. This is a common condition. It does not mean that you have cancer. It usually happens because of hormone changes during a monthly period.  Follow these instructions at home:   Check your breasts after every monthly period. If you do not have monthly periods, check your breasts on the first day of every month. Check for:  ? Soreness.  ? New swelling or puffiness.  ? A change in breast size.  ? A change in a lump that was already there.   Take over-the-counter and prescription medicines only as told by your doctor.   Wear a support or sports bra that fits well. Wear this support especially when you are exercising.   Avoid or have less caffeine, fat, and sugar in what you eat and drink as told by your doctor.  Contact a doctor if:   You have fluid coming from your nipple, especially if the fluid has blood in it.   You have new lumps or bumps in your breast.   Your breast gets puffy, red, and painful.   You have changes in how your breast looks.   Your nipple looks flat or it sinks into your breast.  Get help right away if:   Your breast turns red, and the redness is spreading.  Summary   Fibrocystic breast changes are changes that can make your breasts swollen or painful.   This condition can happen when you have hormone changes during your monthly period.   With this condition, it is important to check your breasts after every monthly period. If you do not have monthly periods, check your breasts on the first day of every month.  This information is not intended to replace advice given to you by your health care provider. Make sure you discuss any questions you have with your health care provider.  Document Released: 06/16/2008 Document Revised: 03/17/2016 Document Reviewed: 03/17/2016  Elsevier Interactive Patient  Education  2017 [employer]

## 2016-08-01 NOTE — MAU Note (Signed)
Pt reports she has had lumpectomies in the past for lumps in her breasts and they have recently started coming back and they are painful to touch. States they are bilateral.

## 2016-08-01 NOTE — MAU Provider Note (Signed)
  History     CSN: [id number]  Arrival date and time: 08/01/16 1949   First Provider Initiated Contact with Patient 08/01/16 2157      Chief Complaint  Patient presents with  . Breast Mass   Michele Jenkins is a 21 y.o. No obstetric history on file who presents today with left painful breast lump. She states that she has noticed it for about month. She also has a tender area on the right breast, and she feels "something" there too. She rates her pain 10/10 at this time. She reports that she had a lumpectomy about 4 years ago. She denies any dischagre from the nipple.   Past Medical History:  Diagnosis Date  . Allergy   . Asthma   . Headache(784.0)   . Vision abnormalities     Past Surgical History:  Procedure Laterality Date  . BREAST CYST EXCISION  August 2012    Family History  Problem Relation Age of Onset  . Alcohol abuse Father   . Sudden death Neg Hx   . Hypertension Neg Hx   . Hyperlipidemia Neg Hx   . Heart attack Neg Hx   . Diabetes Neg Hx     Social History  Substance Use Topics  . Smoking status: Current Every Day Smoker    Packs/day: 0.50    Types: Cigarettes  . Smokeless tobacco: Never Used  . Alcohol use Yes    Allergies:  Allergies  Allergen Reactions  . Pollen Extract     Seasonal allergies    Prescriptions Prior to Admission  Medication Sig Dispense Refill Last Dose  . levETIRAcetam (KEPPRA) 500 MG tablet Take 1 tablet (500 mg total) by mouth 2 (two) times daily. 60 tablet 0     Review of Systems  Constitutional: Negative for chills and fever.  Gastrointestinal: Negative for nausea and vomiting.  Genitourinary: Negative for pelvic pain and vaginal bleeding.   Physical Exam   Blood pressure 102/61, pulse 88, temperature 99.4 F (37.4 C), temperature source Oral, resp. rate 16, height 5\' 6"  (1.676 m), weight 122 lb (55.3 kg), last menstrual period 07/18/2016, SpO2 100 %.  Physical Exam  Nursing note and vitals reviewed. Constitutional:  She is oriented to person, place, and time. She appears well-developed and well-nourished. No distress.  HENT:  Head: Normocephalic.  Cardiovascular: Normal rate.   Respiratory: Effort normal. Right breast exhibits tenderness. Right breast exhibits no inverted nipple, no mass (area of dense breast tissue along the outer aspect. ), no nipple discharge and no skin change. Left breast exhibits mass (2cmx2cm, mobile mass at 1:00). Left breast exhibits no inverted nipple, no nipple discharge, no skin change and no tenderness.    GI: Soft. There is no tenderness. There is no rebound.  Neurological: She is alert and oriented to person, place, and time.  Skin: Skin is warm.  Psychiatric: She has a normal mood and affect.    MAU Course  Procedures  MDM   Assessment and Plan   1. Left breast mass    DC home Comfort measures reviewed  RX: none  Return to MAU as needed   Follow-up Information    [facility]   Specialty:  Diagnostic Radiology Why:  They will call you with an appointment  Contact information: [address] [place] [address] [phone]           Tawnya CrookHogan, Dena Esperanza Donovan 08/01/2016, 10:03 PM

## 2016-08-05 ENCOUNTER — Other Ambulatory Visit: Payer: Self-pay

## 2016-08-18 ENCOUNTER — Other Ambulatory Visit: Payer: Self-pay

## 2016-11-19 ENCOUNTER — Encounter (HOSPITAL_COMMUNITY): Payer: Self-pay | Admitting: Emergency Medicine

## 2016-11-19 ENCOUNTER — Ambulatory Visit (HOSPITAL_COMMUNITY)
Admission: EM | Admit: 2016-11-19 | Discharge: 2016-11-19 | Disposition: A | Discharge status: Home / Self Care | Payer: Self-pay | Attending: Internal Medicine | Admitting: Internal Medicine

## 2016-11-19 DIAGNOSIS — S61011A Laceration without foreign body of right thumb without damage to nail, initial encounter: Secondary | ICD-10-CM

## 2016-11-19 DIAGNOSIS — W25XXXA Contact with sharp glass, initial encounter: Secondary | ICD-10-CM

## 2016-11-19 DIAGNOSIS — Z23 Encounter for immunization: Secondary | ICD-10-CM

## 2016-11-19 HISTORY — DX: Unspecified convulsions: R56.9

## 2016-11-19 MED ORDER — TETANUS-DIPHTH-ACELL PERTUSSIS 5-2.5-18.5 LF-MCG/0.5 IM SUSP
0.5000 mL | Freq: Once | INTRAMUSCULAR | Status: AC
  Administered 2016-11-19: 0.5 mL via INTRAMUSCULAR

## 2016-11-19 MED ORDER — TETANUS-DIPHTH-ACELL PERTUSSIS 5-2.5-18.5 LF-MCG/0.5 IM SUSP
INTRAMUSCULAR | Status: AC
  Filled 2016-11-19: qty 0.5

## 2016-11-19 NOTE — ED Provider Notes (Signed)
CSN: [id number]     Arrival date & time 11/19/16  1603 History   None    Chief Complaint  Patient presents with  . Laceration   (Consider location/radiation/quality/duration/timing/severity/associated sxs/prior Treatment) Patient decided to now come in for the laceration because "it has started bleeding again".     Laceration  Location:  Hand Hand laceration location:  R hand Length:  1.3 cm Depth:  Cutaneous Time since incident:  16 hours Laceration mechanism:  Broken glass Pain details:    Quality:  Aching   Severity:  Moderate   Timing:  Constant   Progression:  Unchanged Foreign body present:  No foreign bodies Relieved by:  Nothing Worsened by:  Nothing Ineffective treatments:  None tried Tetanus status:  Out of date Associated symptoms: no fever, no focal weakness, no rash, no redness and no swelling     Past Medical History:  Diagnosis Date  . Allergy   . Asthma   . Headache(784.0)   . Seizures (HCC)   . Vision abnormalities    Past Surgical History:  Procedure Laterality Date  . BREAST CYST EXCISION  August 2012   Family History  Problem Relation Age of Onset  . Alcohol abuse Father   . Sudden death Neg Hx   . Hypertension Neg Hx   . Hyperlipidemia Neg Hx   . Heart attack Neg Hx   . Diabetes Neg Hx    Social History  Substance Use Topics  . Smoking status: Current Every Day Smoker    Packs/day: 0.50    Types: Cigarettes  . Smokeless tobacco: Never Used  . Alcohol use Yes   OB History    No data available     Review of Systems  Constitutional: Negative for fever.       As stated in the HPI  Skin: Negative for rash.  Neurological: Negative for focal weakness.    Allergies  Pollen extract  Home Medications   Prior to Admission medications   Medication Sig Start Date End Date Taking? Authorizing Provider  levETIRAcetam (KEPPRA) 500 MG tablet Take 1 tablet (500 mg total) by mouth 2 (two) times daily. Patient not taking: Reported on  08/01/2016 05/29/16   [name], David, MD   Meds Ordered and Administered this Visit   Medications  Tdap (BOOSTRIX) injection 0.5 mL (not administered)    BP 111/73 (BP Location: Right Arm)   Pulse 78   Temp 98.6 F (37 C) (Oral)   Resp 16   LMP 10/20/2016   SpO2 100%  No data found.   Physical Exam  Constitutional: She appears well-developed and well-nourished.  Cardiovascular: Normal rate.   Pulmonary/Chest: Effort normal.  Skin:  Has a 1.3 cm superficial laceration on the right thumb. Bleeding stopped.   Nursing note and vitals reviewed.   Urgent Care Course     .Marland Kitchen.Laceration Repair Date/Time: 11/19/2016 5:46 PM Performed by: Lucia EstelleZHENG, Gerson Fauth Authorized by: Eustace MooreMURRAY, LAURA W   Consent:    Consent obtained:  Verbal   Consent given by:  Patient   Risks discussed:  Retained foreign body and need for additional repair   Alternatives discussed:  No treatment Anesthesia (see MAR for exact dosages):    Anesthesia method:  None Laceration details:    Location:  Hand   Hand location: Right.   Length (cm):  1.3   Laceration depth: 1mm. Repair type:    Repair type:  Simple Exploration:    Hemostasis achieved with:  Direct pressure   Wound  extent: no foreign bodies/material noted, no muscle damage noted, no nerve damage noted and no tendon damage noted     Contaminated: no   Treatment:    Wound cleansed with: Dermal wound cleanser. Skin repair:    Repair method:  Tissue adhesive Approximation:    Approximation:  Close   Vermilion border: well-aligned   Post-procedure details:    Dressing:  Adhesive bandage    (including critical care time)  Labs Review Labs Reviewed - No data to display  Imaging Review No results found.  MDM   1. Laceration of right thumb without foreign body without damage to nail, initial encounter    Laceration is superficial. Dermabond applied. Wound covered with gauze dressing. Tetanus given today. Return as needed     Lucia Estelle,  NP 11/19/16 1753

## 2016-11-19 NOTE — Discharge Instructions (Signed)
Keep the wound clean and dry. It could heal fine. Return if not better.

## 2016-11-19 NOTE — ED Triage Notes (Signed)
Cut right thumb on broken glass around midnight.  Bleeding controlled.  Unknown when last tetanus given

## 2016-11-21 ENCOUNTER — Encounter (HOSPITAL_COMMUNITY): Payer: Self-pay | Admitting: *Deleted

## 2016-11-21 ENCOUNTER — Emergency Department (HOSPITAL_COMMUNITY)
Admission: EM | Admit: 2016-11-21 | Discharge: 2016-11-22 | Disposition: A | Discharge status: Other Acute Inpatient Hospital | Payer: Self-pay | Attending: Emergency Medicine | Admitting: Emergency Medicine

## 2016-11-21 ENCOUNTER — Ambulatory Visit (HOSPITAL_COMMUNITY)
Admission: RE | Admit: 2016-11-21 | Discharge: 2016-11-21 | Disposition: A | Discharge status: Home / Self Care | Payer: Self-pay | Attending: Psychiatry | Admitting: Psychiatry

## 2016-11-21 DIAGNOSIS — J45909 Unspecified asthma, uncomplicated: Secondary | ICD-10-CM | POA: Insufficient documentation

## 2016-11-21 DIAGNOSIS — F329 Major depressive disorder, single episode, unspecified: Secondary | ICD-10-CM | POA: Insufficient documentation

## 2016-11-21 DIAGNOSIS — Z79899 Other long term (current) drug therapy: Secondary | ICD-10-CM | POA: Insufficient documentation

## 2016-11-21 DIAGNOSIS — F32A Depression, unspecified: Secondary | ICD-10-CM

## 2016-11-21 DIAGNOSIS — F17210 Nicotine dependence, cigarettes, uncomplicated: Secondary | ICD-10-CM | POA: Insufficient documentation

## 2016-11-21 HISTORY — DX: Major depressive disorder, single episode, unspecified: F32.9

## 2016-11-21 HISTORY — DX: Post-traumatic stress disorder, unspecified: F43.10

## 2016-11-21 HISTORY — DX: Depression, unspecified: F32.A

## 2016-11-21 MED ORDER — LORAZEPAM 1 MG PO TABS
1.0000 mg | ORAL_TABLET | Freq: Three times a day (TID) | ORAL | Status: DC | PRN
Start: 2016-11-21 — End: 2016-11-22

## 2016-11-21 MED ORDER — ACETAMINOPHEN 325 MG PO TABS: 650.0000 mg | ORAL_TABLET | ORAL | Status: DC | PRN
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
## 2016-11-21 NOTE — ED Triage Notes (Signed)
Pt stated [facility].  I have a hx of depression and PTSD.  I used to take meds but I didn't like the way they made me feel.  I haven't taken them in about 4 years."

## 2016-11-21 NOTE — H&P (Signed)
Behavioral Health Medical Screening Exam  Michele Jenkins is an 21 y.o. female, accompanied by her partner, requesting psychiatric intervention due to exacerbated depressive symptoms to include a reported attempted O/D with # 4 Seroquel x 2 weeks ago. She is no longer endorsing SI/SA or HI. She has a hx of depression and has been non compliant with medications and therapy for many years. She endorses a hx of seizure d/o, last seizure petite mal 3 weeks ago. She is non compliant with previously prescribed Keppra. She endorses use of marijuana and alcohol to self medicate.  Total Time spent with patient: 20 minutes  Psychiatric Specialty Exam: Physical Exam  Nursing note and vitals reviewed. Constitutional: She is oriented to person, place, and time. She appears well-developed and well-nourished. No distress.  HENT:  Head: Normocephalic.  Respiratory: Effort normal and breath sounds normal.  Neurological: She is alert and oriented to person, place, and time. No cranial nerve deficit.  Skin: Skin is warm and dry. She is not diaphoretic.    Review of Systems  Neurological: Positive for seizures.  Psychiatric/Behavioral: Positive for depression, substance abuse and suicidal ideas. Negative for hallucinations. The patient is nervous/anxious and has insomnia.     There were no vitals taken for this visit.There is no height or weight on file to calculate BMI.  General Appearance: Casual  Eye Contact:  Good  Speech:  Clear and Coherent  Volume:  Normal  Mood:  Depressed  Affect:  Congruent  Thought Process:  Goal Directed  Orientation:  Full (Time, Place, and Person)  Thought Content:  Negative  Suicidal Thoughts:  Yes.  without intent/plan  Homicidal Thoughts:  No  Memory:  Immediate;   Good  Judgement:  Poor  Insight:  Lacking  Psychomotor Activity:  Negative  Concentration: Concentration: Fair  Recall:  Fair  Fund of Knowledge:Fair  Language: Fair  Akathisia:  Negative  Handed:  Right   AIMS (if indicated):     Assets:  Desire for Improvement  Sleep:       Musculoskeletal: Strength & Muscle Tone: within normal limits Gait & Station: normal Patient leans: N/A  There were no vitals taken for this visit.  Recommendations:  Based on my evaluation the patient appears to have an emergency medical condition for which I recommend the patient be transferred to the emergency department for further evaluation. ddd  Kerry HoughSpencer E Simon, PA-C 11/21/2016, 9:11 PM

## 2016-11-21 NOTE — ED Provider Notes (Signed)
[facility]-EMERGENCY DEPT Provider Note   CSN: [id number] Arrival date & time: 11/21/16  2128     History   Chief Complaint Chief Complaint  Patient presents with  . Depression    HPI Michele Jenkins is a 21 y.o. female.  HPI  Pt presenting from [facility] for medical clearance and to await placement.  Pt was seen earlier tonight due to worsening depression.  She has hx of seizures- has not been taking keppra, due to not being able to get the medicaiton from the pharmacy.  Last seizure was 3 weeks ago, none recently.  No fever/chills, no vomiting.  She also states she took too many seroquel approx 3 weeks ago in an attempt to "sleep". She denies that this was a suicide attempt.  She states that she had not been sleeping well.  She denies any recent illness or substance use. .  There are no other associated systemic symptoms, there are no other alleviating or modifying factors.   Past Medical History:  Diagnosis Date  . Allergy   . Asthma   . Depression   . Headache(784.0)   . PTSD (post-traumatic stress disorder)   . Seizures (HCC)   . Vision abnormalities     Patient Active Problem List   Diagnosis Date Noted  . Right knee injury 06/25/2013  . Oppositional defiant disorder 04/09/2012  . PTSD (post-traumatic stress disorder) 04/07/2012  . MDD (major depressive disorder), single episode, severe (HCC) 04/07/2012    Past Surgical History:  Procedure Laterality Date  . BREAST CYST EXCISION  August 2012    OB History    No data available       Home Medications    Prior to Admission medications   Medication Sig Start Date End Date Taking? Authorizing Provider  levETIRAcetam (KEPPRA) 500 MG tablet Take 1 tablet (500 mg total) by mouth 2 (two) times daily. Patient not taking: Reported on 08/01/2016 05/29/16   Michele Jenkins, David, MD    Family History Family History  Problem Relation Age of Onset  . Alcohol abuse Father   . Sudden death Neg Hx   . Hypertension Neg Hx   . Hyperlipidemia  Neg Hx   . Heart attack Neg Hx   . Diabetes Neg Hx     Social History Social History  Substance Use Topics  . Smoking status: Current Every Day Smoker    Packs/day: 0.50    Types: Cigarettes  . Smokeless tobacco: Never Used  . Alcohol use Yes     Allergies   Pollen extract   Review of Systems Review of Systems  ROS reviewed and all otherwise negative except for mentioned in HPI   Physical Exam Updated Vital Signs BP 105/69 (BP Location: Left Arm)   Pulse (!) 57   Temp 98 F (36.7 C) (Oral)   Resp 16   Ht 5\' 5"  (1.651 m)   Wt 140 lb (63.5 kg)   LMP 11/07/2016   SpO2 100%   BMI 23.30 kg/m  Vitals reviewed Physical Exam Physical Examination: General appearance - alert, well appearing, and in no distress Mental status - alert, oriented to person, place, and time Eyes - no conjunctival injection, no scleral icterus Chest - clear to auscultation, no wheezes, rales or rhonchi, symmetric air entry Heart - normal rate, regular rhythm, normal S1, S2, no murmurs, rubs, clicks or gallops Neurological - alert, oriented x 3, normal speech Extremities - peripheral pulses normal, no pedal edema, no clubbing or cyanosis Skin - normal coloration  and turgor, no rashes Psych- calm and cooperative with flat affect  ED Treatments / Results  Labs (all labs ordered are listed, but only abnormal results are displayed) Labs Reviewed  COMPREHENSIVE METABOLIC PANEL  ETHANOL  CBC WITH DIFFERENTIAL/PLATELET  RAPID URINE DRUG SCREEN, HOSP PERFORMED  ACETAMINOPHEN LEVEL  SALICYLATE LEVEL    EKG  EKG Interpretation None       Radiology No results found.  Procedures Procedures (including critical care time)  Medications Ordered in ED Medications  LORazepam (ATIVAN) tablet 1 mg (not administered)  acetaminophen (TYLENOL) tablet 650 mg (not administered)     Initial Impression / Assessment and Plan / ED Course  I have reviewed the triage vital signs and the nursing  notes.  Pertinent labs & imaging results that were available during my care of the patient were reviewed by me and considered in my medical decision making (see chart for details).     Pt presenting from [facility] due to depression.  She has been recommended for inpatient treatment.  Psych holding orders and med clearance labs have been ordered.  Pt does not have acute illness.  No recent overdose.  Pt awaiting placement by [facility].   Final Clinical Impressions(s) / ED Diagnoses   Final diagnoses:  Depression, unspecified depression type    New Prescriptions New Prescriptions   No medications on file     [name], MD 11/21/16 2352

## 2016-11-22 ENCOUNTER — Encounter (HOSPITAL_COMMUNITY): Payer: Self-pay

## 2016-11-22 ENCOUNTER — Observation Stay (HOSPITAL_COMMUNITY)
Admission: EM | Admit: 2016-11-22 | Discharge: 2016-11-23 | Disposition: A | Discharge status: Home / Self Care | Payer: Medicaid Other | Source: Intra-hospital | Attending: Psychiatry | Admitting: Psychiatry

## 2016-11-22 DIAGNOSIS — Z91048 Other nonmedicinal substance allergy status: Secondary | ICD-10-CM | POA: Insufficient documentation

## 2016-11-22 DIAGNOSIS — Z9889 Other specified postprocedural states: Secondary | ICD-10-CM | POA: Insufficient documentation

## 2016-11-22 DIAGNOSIS — F339 Major depressive disorder, recurrent, unspecified: Principal | ICD-10-CM | POA: Insufficient documentation

## 2016-11-22 DIAGNOSIS — Z79899 Other long term (current) drug therapy: Secondary | ICD-10-CM | POA: Insufficient documentation

## 2016-11-22 DIAGNOSIS — R569 Unspecified convulsions: Secondary | ICD-10-CM | POA: Insufficient documentation

## 2016-11-22 DIAGNOSIS — F4310 Post-traumatic stress disorder, unspecified: Secondary | ICD-10-CM | POA: Insufficient documentation

## 2016-11-22 DIAGNOSIS — F332 Major depressive disorder, recurrent severe without psychotic features: Secondary | ICD-10-CM | POA: Diagnosis present

## 2016-11-22 DIAGNOSIS — Z811 Family history of alcohol abuse and dependence: Secondary | ICD-10-CM | POA: Insufficient documentation

## 2016-11-22 LAB — CBC WITH DIFFERENTIAL/PLATELET
BASOS ABS: 0 10*3/uL (ref 0.0–0.1)
Basophils Relative: 0 %
EOS ABS: 0.3 10*3/uL (ref 0.0–0.7)
EOS PCT: 5 %
HCT: 35.4 % — ABNORMAL LOW (ref 36.0–46.0)
Hemoglobin: 11.9 g/dL — ABNORMAL LOW (ref 12.0–15.0)
Lymphocytes Relative: 40 %
Lymphs Abs: 2.2 10*3/uL (ref 0.7–4.0)
MCH: 31.5 pg (ref 26.0–34.0)
MCHC: 33.6 g/dL (ref 30.0–36.0)
MCV: 93.7 fL (ref 78.0–100.0)
Monocytes Absolute: 0.3 10*3/uL (ref 0.1–1.0)
Monocytes Relative: 6 %
Neutro Abs: 2.8 10*3/uL (ref 1.7–7.7)
Neutrophils Relative %: 49 %
PLATELETS: 221 10*3/uL (ref 150–400)
RBC: 3.78 MIL/uL — AB (ref 3.87–5.11)
RDW: 13.7 % (ref 11.5–15.5)
WBC: 5.6 10*3/uL (ref 4.0–10.5)

## 2016-11-22 LAB — COMPREHENSIVE METABOLIC PANEL
ALT: 8 U/L — AB (ref 14–54)
ANION GAP: 8 (ref 5–15)
AST: 14 U/L — ABNORMAL LOW (ref 15–41)
Albumin: 4.2 g/dL (ref 3.5–5.0)
Alkaline Phosphatase: 43 U/L (ref 38–126)
BILIRUBIN TOTAL: 0.3 mg/dL (ref 0.3–1.2)
BUN: 13 mg/dL (ref 6–20)
CALCIUM: 9.1 mg/dL (ref 8.9–10.3)
CO2: 22 mmol/L (ref 22–32)
Chloride: 109 mmol/L (ref 101–111)
Creatinine, Ser: 0.68 mg/dL (ref 0.44–1.00)
GFR calc non Af Amer: >60 mL/min (ref >60)
GLUCOSE: 88 mg/dL (ref 65–99)
Potassium: 3.5 mmol/L (ref 3.5–5.1)
Sodium: 139 mmol/L (ref 135–145)
TOTAL PROTEIN: 7 g/dL (ref 6.5–8.1)

## 2016-11-22 LAB — RAPID URINE DRUG SCREEN, HOSP PERFORMED
AMPHETAMINES: NOT DETECTED
BENZODIAZEPINES: NOT DETECTED
Barbiturates: NOT DETECTED
COCAINE: NOT DETECTED
OPIATES: NOT DETECTED
Tetrahydrocannabinol: POSITIVE — AB

## 2016-11-22 LAB — ETHANOL: Alcohol, Ethyl (B): <5 mg/dL (ref <5)

## 2016-11-22 LAB — ACETAMINOPHEN LEVEL: Acetaminophen (Tylenol), Serum: <10 ug/mL — ABNORMAL LOW (ref 10–30)

## 2016-11-22 LAB — SALICYLATE LEVEL: Salicylate Lvl: <7 mg/dL (ref 2.8–30.0)

## 2016-11-22 MED ORDER — TRAZODONE HCL 50 MG PO TABS: 50.0000 mg | ORAL_TABLET | Freq: Every evening | ORAL | Status: DC | PRN

## 2016-11-22 MED ORDER — HYDROXYZINE HCL 25 MG PO TABS: 25.0000 mg | ORAL_TABLET | Freq: Four times a day (QID) | ORAL | Status: DC | PRN

## 2016-11-22 MED ORDER — ALUM & MAG HYDROXIDE-SIMETH 200-200-20 MG/5ML PO SUSP: 30.0000 mL | ORAL | Status: DC | PRN

## 2016-11-22 MED ORDER — ACETAMINOPHEN 325 MG PO TABS: 650.0000 mg | ORAL_TABLET | Freq: Four times a day (QID) | ORAL | Status: DC | PRN

## 2016-11-22 MED ORDER — MAGNESIUM HYDROXIDE 400 MG/5ML PO SUSP: 30.0000 mL | Freq: Every day | ORAL | Status: DC | PRN

## 2016-11-22 MED ORDER — LEVETIRACETAM 500 MG PO TABS
500.0000 mg | ORAL_TABLET | Freq: Two times a day (BID) | ORAL | Status: DC
  Administered 2016-11-22 – 2016-11-23 (×3): 500 mg via ORAL
  Filled 2016-11-22 (×3): qty 1

## 2016-11-22 MED ORDER — MIRTAZAPINE 15 MG PO TABS
7.5000 mg | ORAL_TABLET | Freq: Every day | ORAL | Status: DC
  Administered 2016-11-22: 7.5 mg via ORAL
  Filled 2016-11-22: qty 1

## 2016-11-22 MED ORDER — FLUOXETINE HCL 20 MG PO CAPS
20.0000 mg | ORAL_CAPSULE | Freq: Every day | ORAL | Status: DC
  Administered 2016-11-22: 20 mg via ORAL
  Filled 2016-11-22: qty 1
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
## 2016-11-22 NOTE — Progress Notes (Signed)
D: Pt awake in bed watching TV at this time.  A & O X4. Presents with flat affect and depressed mood. Denies SI, HI, AVH and pain when assessed "not right now, I just want to know how long I'll be here after I see the doctor". Per pt "I took the Seroquel to sleep not to hurt myself". A: Support and encouragement provided to pt. Scheduled medications administered as prescribed. Encouraged pt to voice concerns, comply with current treatment regimen. Continuous observation maintained without outburst or self harm gestures thus far.  R: Pt receptive to care. Compliant with medications. Denies adverse drug reactions. Showered and changed scrubs earlier this shift. Tolerated all PO intake well. Denies concerns at this time. Safety maintained on and off unit.
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
## 2016-11-22 NOTE — ED Notes (Signed)
[facility], spoke with Tiara.  She stated the receiving RN is not available & requested # for call back.  # provided.

## 2016-11-22 NOTE — ED Notes (Signed)
Dr. Lynelle DoctorKnapp in to see pt.

## 2016-11-22 NOTE — ED Provider Notes (Signed)
3:37 AM  [facility] by Dr Lucianne MussKumar, she is agreeable to being admitted.   Devoria AlbeIva Aicia Babinski, MD, Concha PyoFACEP     Danielle Lento, MD 11/22/16 [phone]

## 2016-11-23 MED ORDER — HYDROXYZINE HCL 25 MG PO TABS: 25.0000 mg | ORAL_TABLET | Freq: Four times a day (QID) | ORAL | 0 refills | Status: AC | PRN

## 2016-11-23 MED ORDER — MIRTAZAPINE 7.5 MG PO TABS: 7.5000 mg | ORAL_TABLET | Freq: Every day | ORAL | 0 refills | Status: AC

## 2016-11-23 MED ORDER — LEVETIRACETAM 500 MG PO TABS: 500.0000 mg | ORAL_TABLET | Freq: Two times a day (BID) | ORAL | 0 refills | Status: DC

## 2016-11-23 NOTE — [facility] Counselor (Signed)
This Clinical research associatewriter provided pt with the walk-in clinic hours [facility], which are M-F from 8a-3p. Pt was instructed to bring a photo ID, along with a list of medications if applicable and insurance information if applicable. Pt was provided prescriptions and is currently denying SI/HI/AVH.

## 2016-11-23 NOTE — Progress Notes (Signed)
Pt spends most of the shift before sleeping on the phone with her fiance.  Pt is calm and cooperative and denies any pain or discomfort.  Pt denies SI, HI and AVH.  Pt contracts for safety, verbally. Pt continuously observed on unit for safety except when in the bathroom. Pt remains safe on unit.

## 2016-11-23 NOTE — Discharge Summary (Signed)
Patient discharged from observation unit. Please see follow up after care. At this time , the patient was evaluated by the attending MD and was discussed with the team. The team has determined the patient to be stable and appropriate for discharge with medical approval.   Day of discharge assessment included a suicide and violence risk assessment. Risk factors for self-harm/suicide and violence that are present at time of discharge include: younger age, prior expressed suicidal ideation, substance use, psychiatric history of major depression. These risk factors are mitigated by the following factors:well known to mental health system, available outpatient providers, presence of follow-up plan. Furthermore, the team has attempted to mitigate risk through supportive psychotherapy, providing psycho-education, thoughtful medication management, and communication with outpatient providers for continuity of care. The patient was educated about relevant modifiable risk factors including following recommendations for treatment of psychiatric illness and abstaining from substance abuse.  While future psychiatric events cannot be accurately predicted, the patient does not currently require further acute inpatient psychiatric care and does not currently meet [facility] criteria. It is recommended that the patient continue treatment in outpatient care. A follow up plan and crisis plan are in place, have been discussed with the patient, and the patient agrees to the plan at time of discharge.   Michele Jenkins was educated about medication efficacy, risk factors for suicide, and treatment options.She is started on mirtazapine 7.5mg  po qhs for depression. Patient does endorsed acute psychiatric symptoms., however does not meet criteria for inpatient services.

## 2016-11-23 NOTE — Progress Notes (Addendum)
D) Pt. Seen by MD and NP and orders written for d/c.  Pt. Was d/c this am.  Pt. Was planning to attend her grandmother's funeral. Affect sad.  Pt. Denied SI/HI and denied A/V hallucinations, denied pain.  A) Reviewed AVS, copy given. [facility] .  Reviewed medications and need to remain compliant.  Prescriptions provided. Belongings returned.  Escorted to lobby.

## 2017-10-08 IMAGING — CR DG CERVICAL SPINE COMPLETE 4+V
6 series · 7 of 7 positions shown · non-contrast
Comparison: CT cervical spine 01/10/2016

CLINICAL DATA: Altered mental status.

EXAM:
CERVICAL SPINE - COMPLETE 4+ VIEW

[c-spine lat]
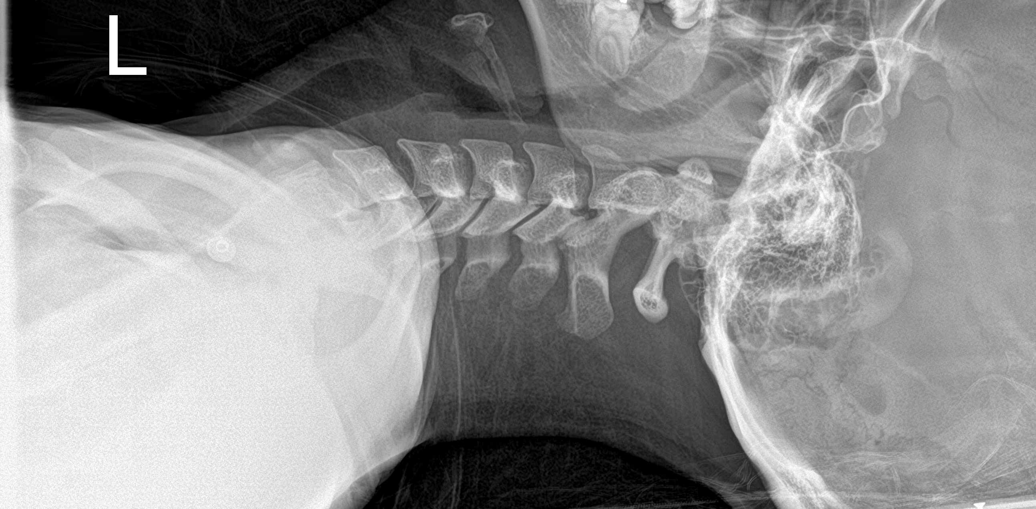

[c-spine obl (1 of 2)]
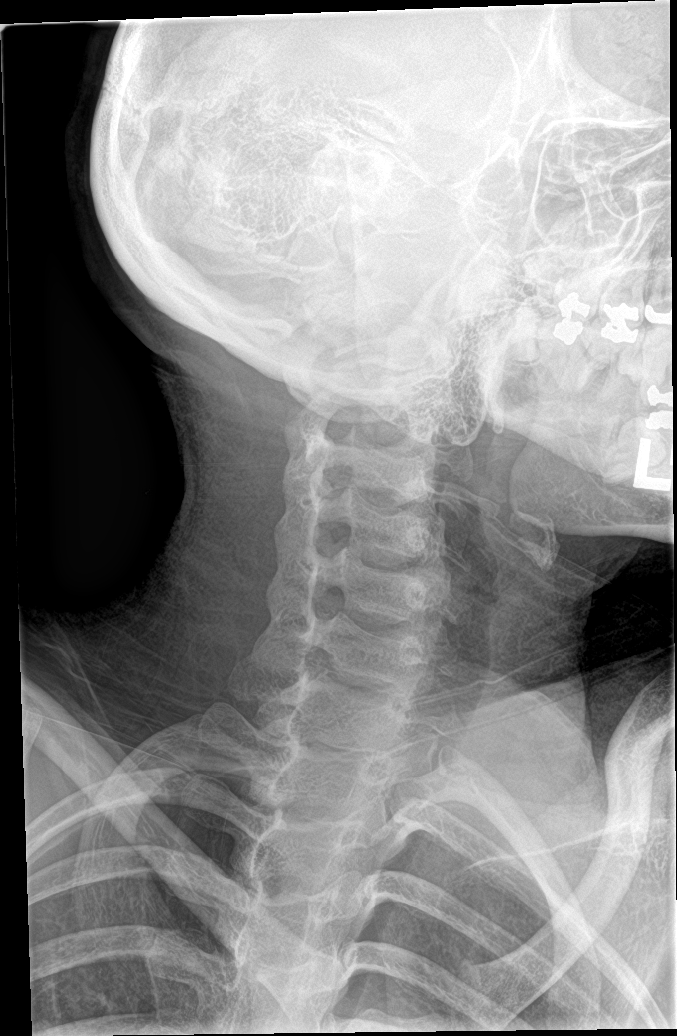

[Series 3: c-spine obl · 0.14mm/px · 2 of 2 slices shown (2 of 2)]
[im 1/2]
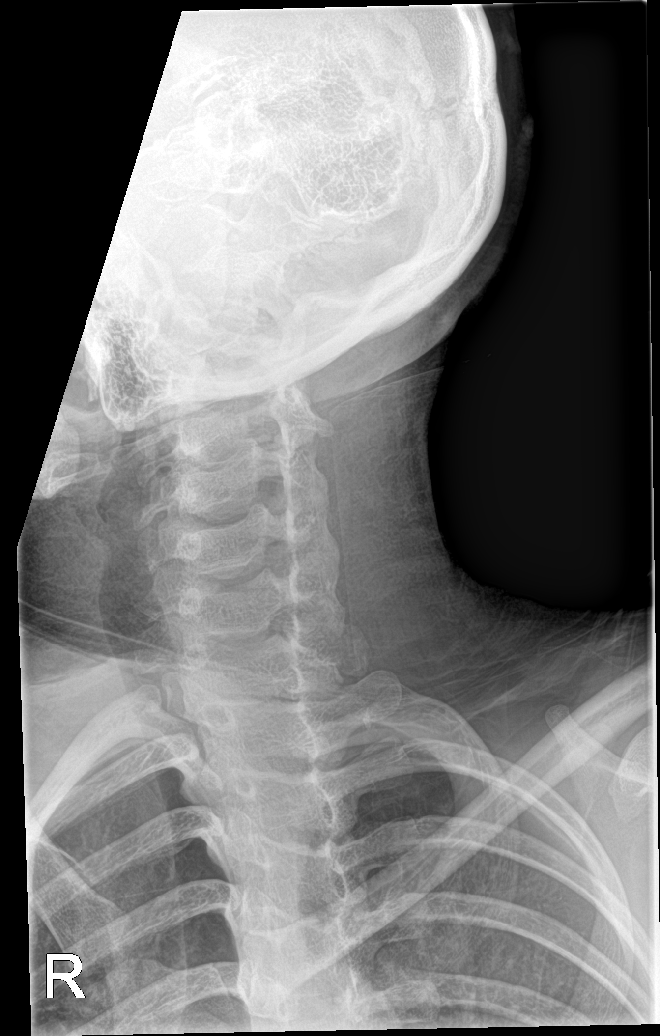
[im 2/2]
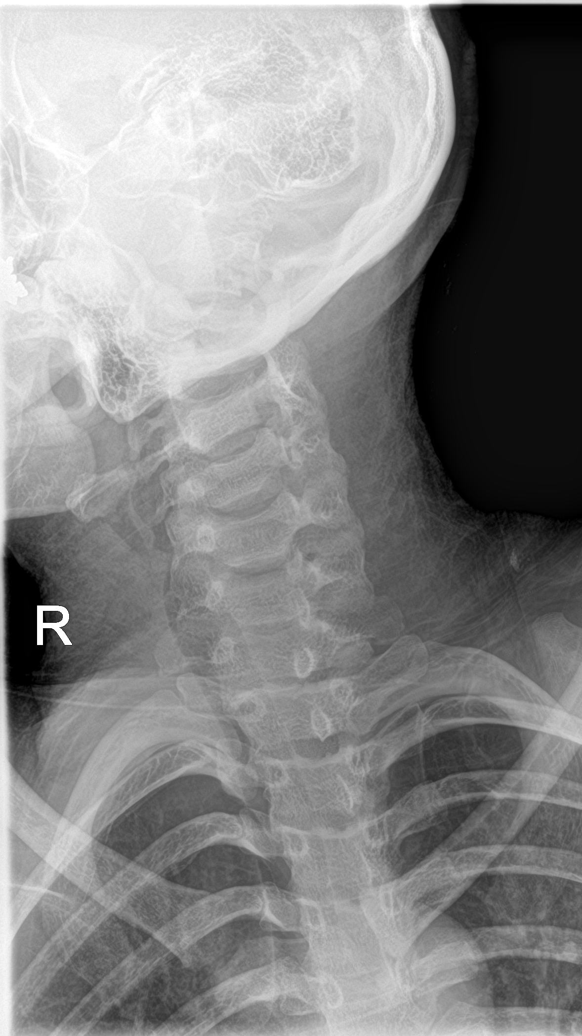

[c-spine ap]
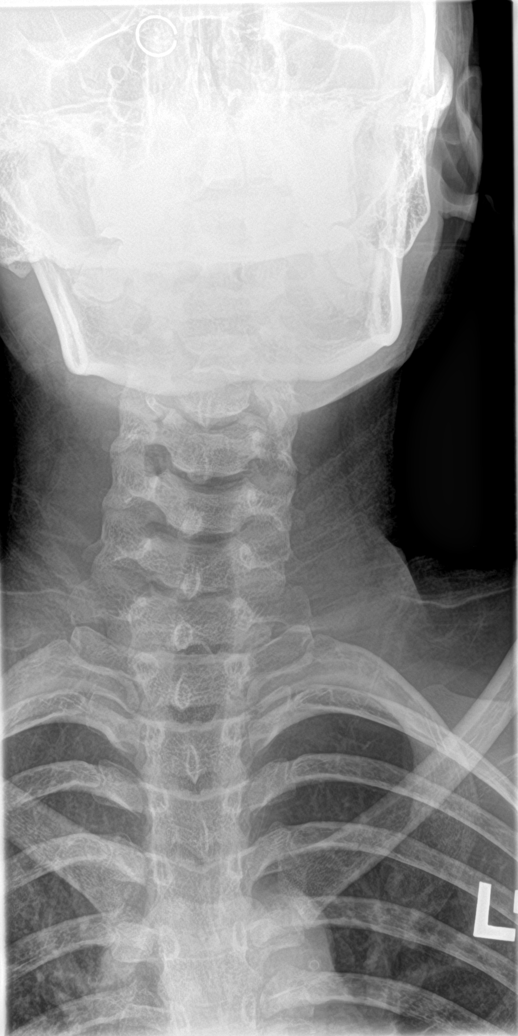

[c-spine open mouth]
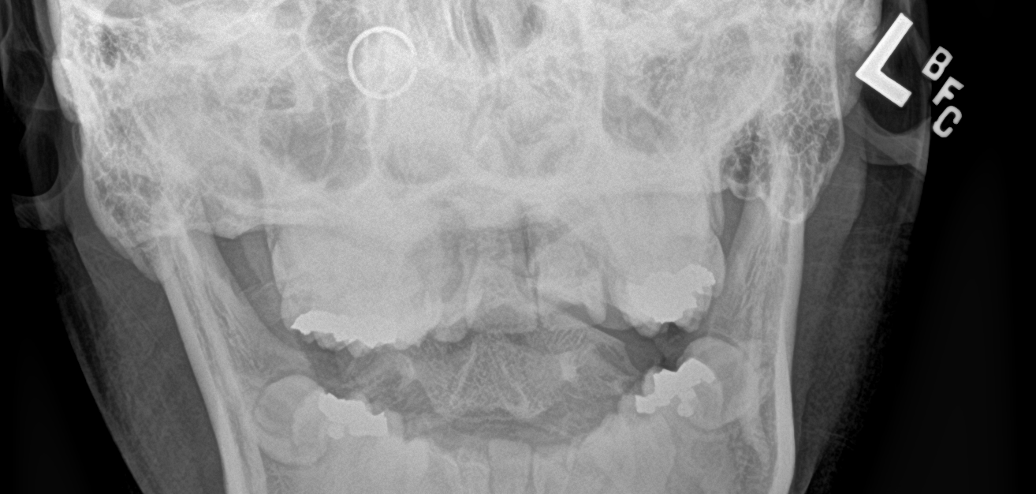

[c-spine swimmers]
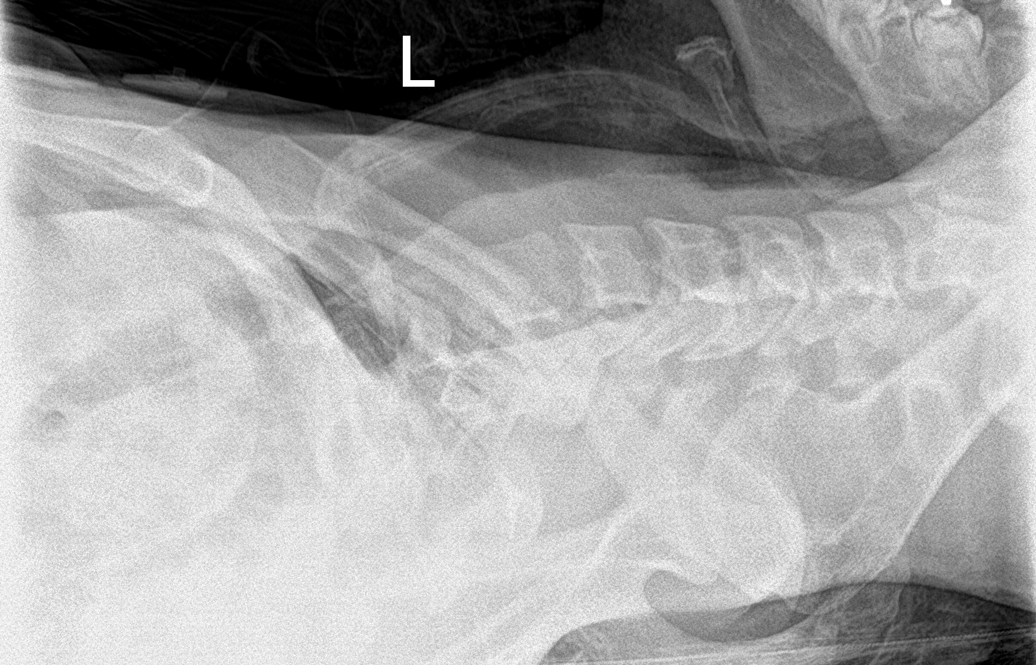

[7 of 7 positions shown; findings below may reference images not displayed]

FINDINGS: There is no evidence of cervical spine fracture or prevertebral soft
tissue swelling. Alignment is normal. No other significant bone
abnormalities are identified.
IMPRESSION: Negative cervical spine radiographs.

## 2017-10-08 IMAGING — CR DG HAND COMPLETE 3+V*L*
3 series · 3 of 3 positions shown · non-contrast
Comparison: 03/12/2016

CLINICAL DATA: Left hand pain.  Syncope.

EXAM:
LEFT HAND - COMPLETE 3+ VIEW

[hand pa]
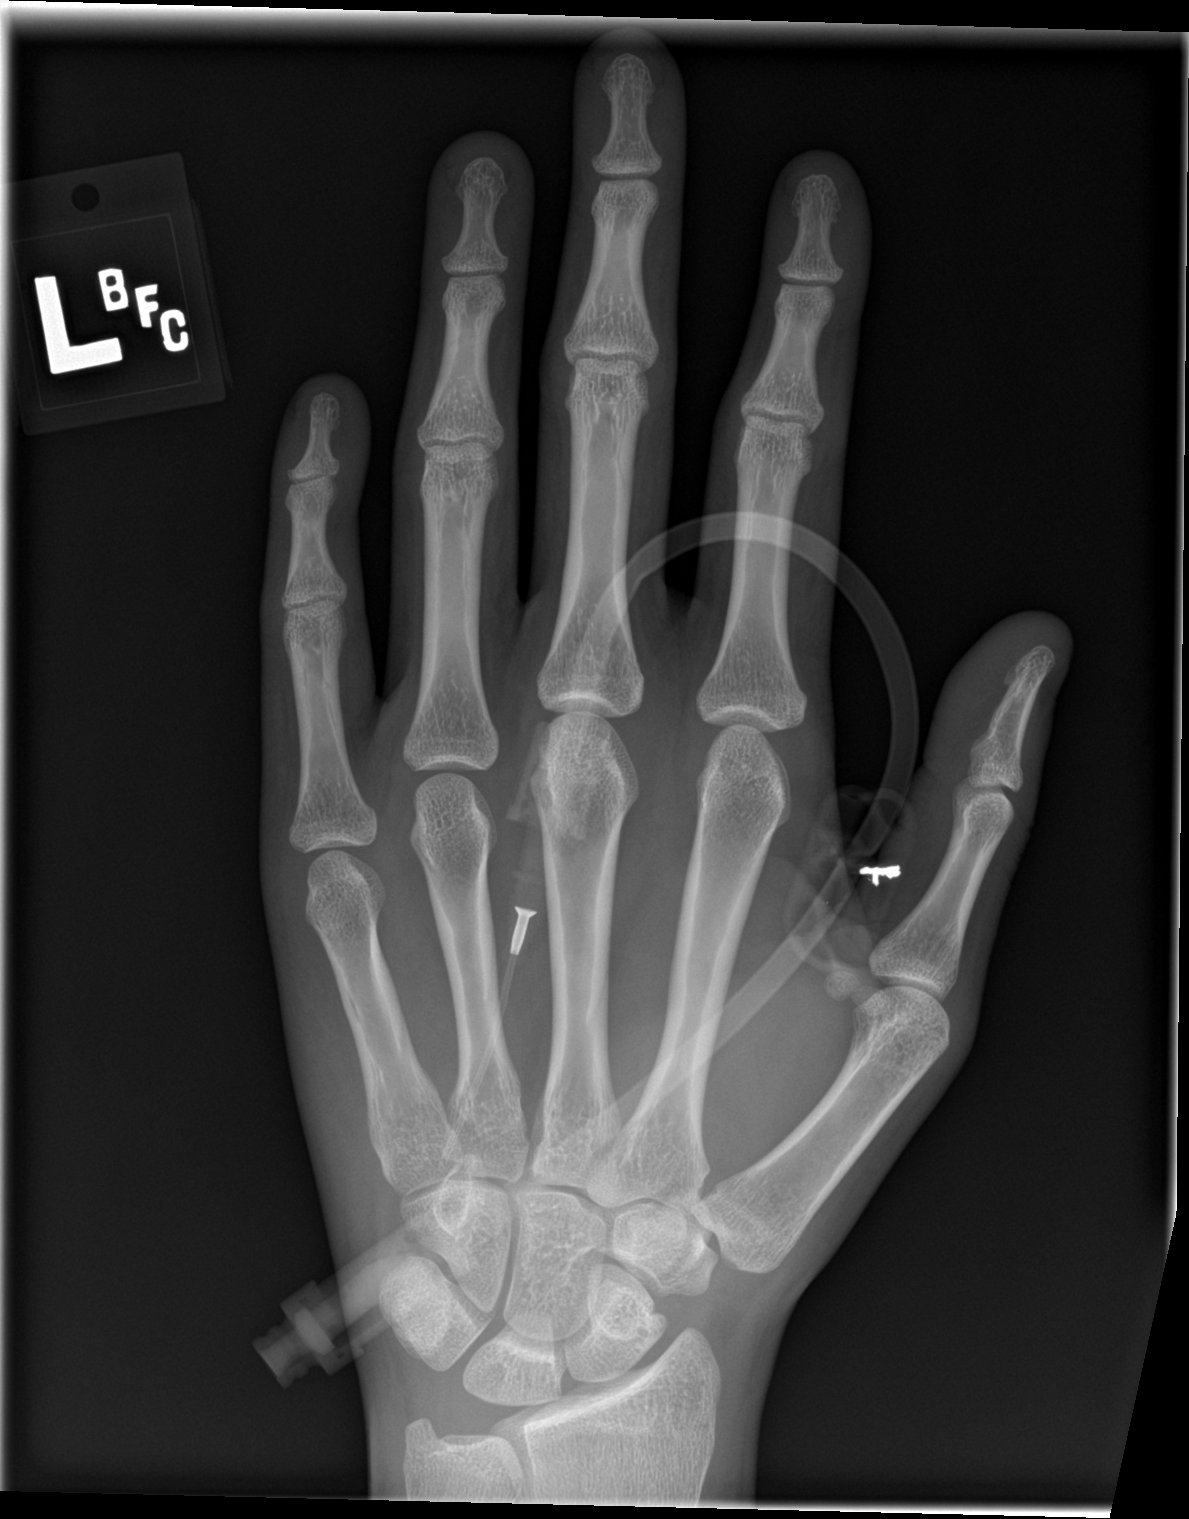

[hand obl]
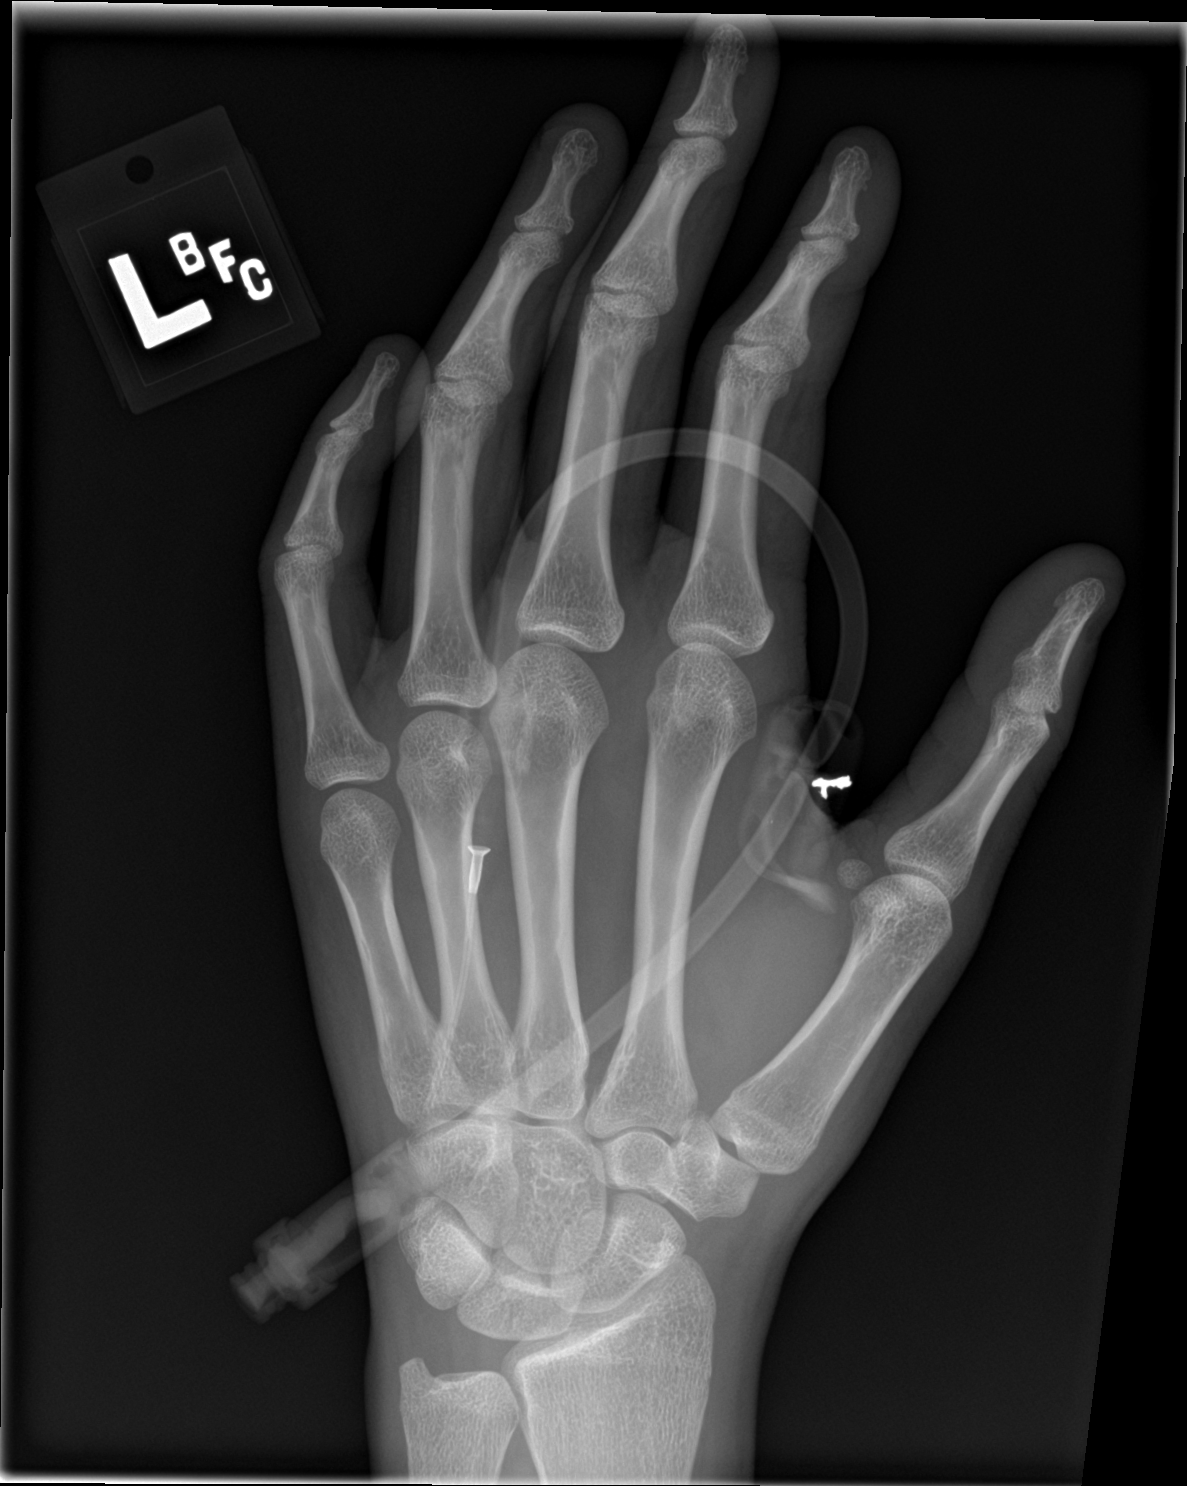

[hand lat]
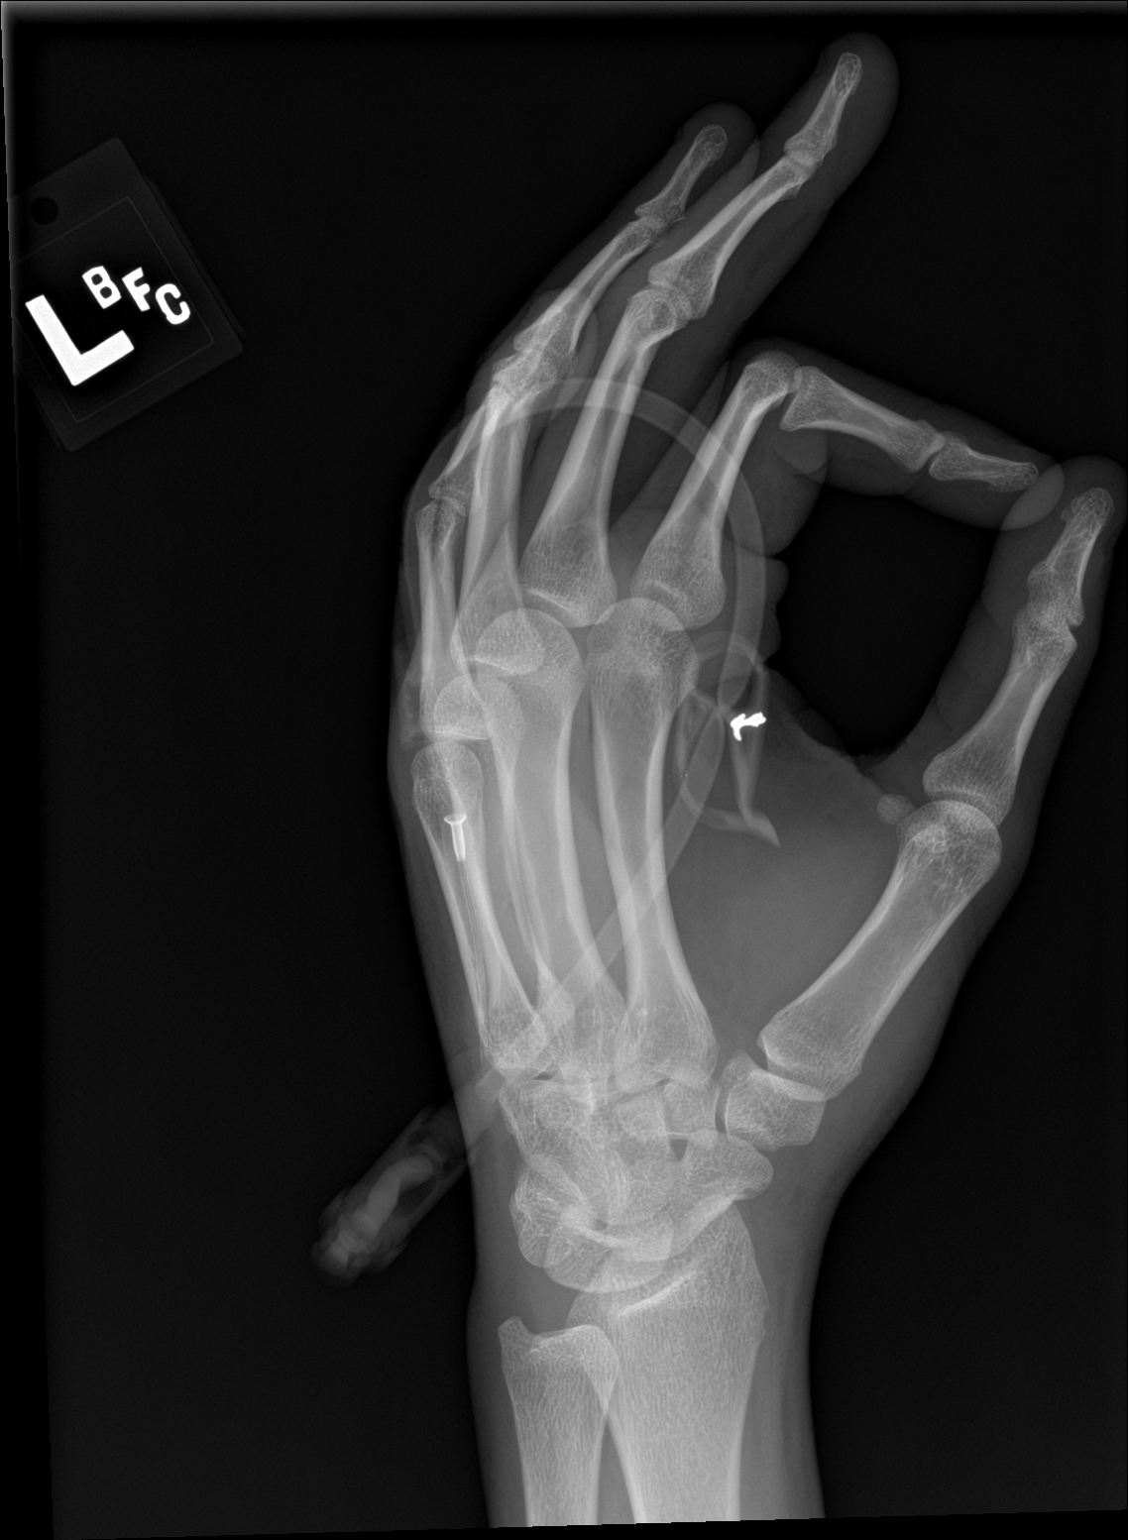

[3 of 3 positions shown; findings below may reference images not displayed]

FINDINGS: There is no evidence of fracture or dislocation. There is no
evidence of arthropathy or other focal bone abnormality. Soft
tissues are unremarkable.
IMPRESSION: Negative.

## 2018-03-04 ENCOUNTER — Encounter (HOSPITAL_COMMUNITY): Payer: Self-pay

## 2018-03-04 ENCOUNTER — Emergency Department (HOSPITAL_COMMUNITY)
Admission: EM | Admit: 2018-03-04 | Discharge: 2018-03-05 | Disposition: A | Discharge status: Home / Self Care | Payer: Self-pay | Attending: Emergency Medicine | Admitting: Emergency Medicine

## 2018-03-04 DIAGNOSIS — J45909 Unspecified asthma, uncomplicated: Secondary | ICD-10-CM | POA: Insufficient documentation

## 2018-03-04 DIAGNOSIS — Z91199 Patient's noncompliance with other medical treatment and regimen due to unspecified reason: Secondary | ICD-10-CM

## 2018-03-04 DIAGNOSIS — G40919 Epilepsy, unspecified, intractable, without status epilepticus: Secondary | ICD-10-CM | POA: Insufficient documentation

## 2018-03-04 DIAGNOSIS — Z9119 Patient's noncompliance with other medical treatment and regimen: Secondary | ICD-10-CM | POA: Insufficient documentation

## 2018-03-04 DIAGNOSIS — F17210 Nicotine dependence, cigarettes, uncomplicated: Secondary | ICD-10-CM | POA: Insufficient documentation

## 2018-03-04 LAB — CBC WITH DIFFERENTIAL/PLATELET
BASOS PCT: 0 %
Basophils Absolute: 0 10*3/uL (ref 0.0–0.1)
EOS ABS: 0.1 10*3/uL (ref 0.0–0.7)
Eosinophils Relative: 1 %
HEMATOCRIT: 41.2 % (ref 36.0–46.0)
Hemoglobin: 13.7 g/dL (ref 12.0–15.0)
Lymphocytes Relative: 24 %
Lymphs Abs: 1.5 10*3/uL (ref 0.7–4.0)
MCH: 31.9 pg (ref 26.0–34.0)
MCHC: 33.3 g/dL (ref 30.0–36.0)
MCV: 96 fL (ref 78.0–100.0)
MONO ABS: 0.4 10*3/uL (ref 0.1–1.0)
MONOS PCT: 6 %
Neutro Abs: 4.2 10*3/uL (ref 1.7–7.7)
Neutrophils Relative %: 69 %
Platelets: 251 10*3/uL (ref 150–400)
RBC: 4.29 MIL/uL (ref 3.87–5.11)
RDW: 13.7 % (ref 11.5–15.5)
WBC: 6.1 10*3/uL (ref 4.0–10.5)

## 2018-03-04 LAB — COMPREHENSIVE METABOLIC PANEL
ALK PHOS: 49 U/L (ref 38–126)
ALT: 10 U/L (ref 0–44)
AST: 18 U/L (ref 15–41)
Albumin: 4.4 g/dL (ref 3.5–5.0)
Anion gap: 10 (ref 5–15)
BUN: 15 mg/dL (ref 6–20)
CALCIUM: 9 mg/dL (ref 8.9–10.3)
CO2: 22 mmol/L (ref 22–32)
CREATININE: 0.82 mg/dL (ref 0.44–1.00)
Chloride: 108 mmol/L (ref 98–111)
GFR calc Af Amer: >60 mL/min (ref >60)
GFR calc non Af Amer: >60 mL/min (ref >60)
GLUCOSE: 91 mg/dL (ref 70–99)
Potassium: 3.8 mmol/L (ref 3.5–5.1)
SODIUM: 140 mmol/L (ref 135–145)
Total Bilirubin: 0.3 mg/dL (ref 0.3–1.2)
Total Protein: 7.7 g/dL (ref 6.5–8.1)

## 2018-03-04 LAB — ACETAMINOPHEN LEVEL: Acetaminophen (Tylenol), Serum: <10 ug/mL — ABNORMAL LOW (ref 10–30)

## 2018-03-04 LAB — SALICYLATE LEVEL: Salicylate Lvl: <7 mg/dL (ref 2.8–30.0)

## 2018-03-04 LAB — I-STAT BETA HCG BLOOD, ED (MC, WL, AP ONLY): I-stat hCG, quantitative: <5 m[IU]/mL (ref <5)

## 2018-03-04 LAB — ETHANOL: Alcohol, Ethyl (B): <10 mg/dL (ref <10)

## 2018-03-04 MED ORDER — SODIUM CHLORIDE 0.9 % IV SOLN: INTRAVENOUS | Status: DC

## 2018-03-04 MED ORDER — THIAMINE HCL 100 MG/ML IJ SOLN
100.0000 mg | Freq: Once | INTRAMUSCULAR | Status: AC
  Administered 2018-03-04: 100 mg via INTRAVENOUS
  Filled 2018-03-04: qty 2

## 2018-03-04 MED ORDER — LEVETIRACETAM 500 MG PO TABS: 500.0000 mg | ORAL_TABLET | Freq: Two times a day (BID) | ORAL | 0 refills | Status: AC

## 2018-03-04 MED ORDER — LEVETIRACETAM IN NACL 1000 MG/100ML IV SOLN
1000.0000 mg | Freq: Once | INTRAVENOUS | Status: AC
  Administered 2018-03-04: 1000 mg via INTRAVENOUS
  Filled 2018-03-04: qty 100

## 2018-03-04 MED ORDER — SODIUM CHLORIDE 0.9 % IV BOLUS
1000.0000 mL | Freq: Once | INTRAVENOUS | Status: AC
  Administered 2018-03-04: 1000 mL via INTRAVENOUS

## 2018-03-04 MED ORDER — IBUPROFEN 800 MG PO TABS
800.0000 mg | ORAL_TABLET | Freq: Once | ORAL | Status: AC
  Administered 2018-03-04: 800 mg via ORAL
  Filled 2018-03-04: qty 1

## 2018-03-04 NOTE — Discharge Instructions (Addendum)
You need to start taking keppra as prescribed without missing any doses.   Avoid alcohol and any other illicit drug use as these can lower your seizure threshold.   Stay well hydrated.   Follow up with neurology to obtain future refills on Keppra

## 2018-03-04 NOTE — ED Triage Notes (Signed)
Pt arrived by EMS from home. EMS reports that Pt's neighbor called EMS due to Pt having seizure like activity. EMS reports that Pt did not fall or hit head. Pt has hx of seizures and is noncompliant with medications. EMS reports that Pt was swimming with friends today and had complaint of headache. Pt reported to EMS that she drank some alcohol and smoked marijuana earlier today. Pt is postictal on arrival to ER.

## 2018-03-04 NOTE — ED Notes (Signed)
Bed: WA20 Expected date:  Expected time:  Means of arrival:  Comments: EMS 22 yo female witnessed seizure/post-ictal-hx seizures and compliant with medications-currently alert and oriented

## 2018-03-04 NOTE — ED Provider Notes (Signed)
[facility]-EMERGENCY DEPT Provider Note   CSN: [id number] Arrival date & time: 03/04/18  2039     History   Chief Complaint Chief Complaint  Patient presents with  . Seizures    HPI Michele Jenkins is a 22 y.o. female with pmh of seizures, medical non compliance, asthma, PTSD is here for witnessed seizure like activity by neighbor.  History obtained from ER RN who obtained report from EMS and patient. Patient admits to drinking alcohol and smoking marijuana earlier today.  She cannot tell me how much she drank. Thinks she had a seizure.  States she frequently gets seizures after drinking alcohol.  She has not taken keppra in "months". Currently reports headache that is global, mild, constant.  She typically gets post seizure headache and drowsiness.  She denies fevers, vision changes, nausea, vomiting, CP, abdominal pain, nausea, vomiting, loss of sensation or heaviness to extremities.   HPI  Past Medical History:  Diagnosis Date  . Allergy   . Asthma   . Depression   . Headache(784.0)   . PTSD (post-traumatic stress disorder)   . PTSD (post-traumatic stress disorder)   . Seizures (HCC)   . Vision abnormalities     Patient Active Problem List   Diagnosis Date Noted  . MDD (major depressive disorder), recurrent episode, severe (HCC) 11/22/2016  . Right knee injury 06/25/2013  . Oppositional defiant disorder 04/09/2012  . PTSD (post-traumatic stress disorder) 04/07/2012  . MDD (major depressive disorder), single episode, severe (HCC) 04/07/2012    Past Surgical History:  Procedure Laterality Date  . BREAST CYST EXCISION  August 2012     OB History   None      Home Medications    Prior to Admission medications   Medication Sig Start Date End Date Taking? Authorizing Provider  hydrOXYzine (ATARAX/VISTARIL) 25 MG tablet Take 1 tablet (25 mg total) by mouth every 6 (six) hours as needed for anxiety. Patient not taking: Reported on 03/04/2018 11/23/16    Truman Hayward, FNP  levETIRAcetam (KEPPRA) 500 MG tablet Take 1 tablet (500 mg total) by mouth 2 (two) times daily. 03/04/18   Liberty Handy, PA-C  mirtazapine (REMERON) 7.5 MG tablet Take 1 tablet (7.5 mg total) by mouth at bedtime. Patient not taking: Reported on 03/04/2018 11/23/16   Truman Hayward, FNP    Family History Family History  Problem Relation Age of Onset  . Alcohol abuse Father   . Sudden death Neg Hx   . Hypertension Neg Hx   . Hyperlipidemia Neg Hx   . Heart attack Neg Hx   . Diabetes Neg Hx     Social History Social History   Tobacco Use  . Smoking status: Current Every Day Smoker    Packs/day: 0.50    Types: Cigarettes  . Smokeless tobacco: Never Used  . Tobacco comment: Refused  Substance Use Topics  . Alcohol use: Yes    Comment: one fifth per day liquor  . Drug use: Yes    Types: Marijuana     Allergies   Pollen extract   Review of Systems Review of Systems  Neurological: Positive for seizures and headaches.  All other systems reviewed and are negative.    Physical Exam Updated Vital Signs BP 116/73   Pulse 90   Temp 98 F (36.7 C) (Oral)   Resp (!) 21   Ht 5\' 5"  (1.651 m)   Wt 53.5 kg   SpO2 100%   BMI 19.63 kg/m  Physical Exam  Constitutional: She is oriented to person, place, and time. She appears well-developed and well-nourished.  Awake, alert. Provides short 1-2 word answers. In NAD.  HENT:  Head: Normocephalic and atraumatic.  Right Ear: External ear normal.  Left Ear: External ear normal.  Nose: Nose normal.  MMM. No intraoral or tongue injury.  Eyes: Conjunctivae and EOM are normal.  Neck: Normal range of motion. Neck supple.  c-spine: no midline tenderness. Full PROM without stiffness or pain noted.   Cardiovascular: Normal rate, regular rhythm and normal heart sounds.  Pulmonary/Chest: Effort normal and breath sounds normal.  Abdominal: Soft. There is no tenderness.  Musculoskeletal: Normal range of  motion.  Neurological: She is alert and oriented to person, place, and time.  Alert and oriented to self, place, time and event.  Speech is fluent without obvious dysarthria or dysphasia. Strength 5/5 with hand grip and ankle F/E.   Sensation to light touch intact in hands and feet. CN I and VIII not tested. CN II-XII grossly intact bilaterally.   Skin: Skin is warm and dry. Capillary refill takes less than 2 seconds.  Psychiatric: Her behavior is normal.  Flat affect, withdrawn.   Nursing note and vitals reviewed.    ED Treatments / Results  Labs (all labs ordered are listed, but only abnormal results are displayed) Labs Reviewed  ACETAMINOPHEN LEVEL - Abnormal; Notable for the following components:      Result Value   Acetaminophen (Tylenol), Serum <10 (*)    All other components within normal limits  CBC WITH DIFFERENTIAL/PLATELET  SALICYLATE LEVEL  ETHANOL  COMPREHENSIVE METABOLIC PANEL  CBG MONITORING, ED  I-STAT BETA HCG BLOOD, ED ([facility], [facility], [facility] ONLY)  CBG MONITORING, ED    EKG EKG Interpretation  Date/Time:  Sunday March 04 2018 21:06:08 EDT Ventricular Rate:  81 PR Interval:    QRS Duration: 86 QT Interval:  337 QTC Calculation: 392 R Axis:   75 Text Interpretation:  Sinus rhythm RSR' in V1 or V2, right VCD or RVH Abnormal ekg Confirmed by Gerhard MunchLockwood, Robert [phone]) on 03/04/2018 9:20:59 PM   Radiology No results found.  Procedures Procedures (including critical care time)  Medications Ordered in ED Medications  ibuprofen (ADVIL,MOTRIN) tablet 800 mg (has no administration in time range)  levETIRAcetam (KEPPRA) IVPB 1000 mg/100 mL premix (0 mg Intravenous Stopped 03/04/18 2136)  thiamine (B-1) injection 100 mg (100 mg Intravenous Given 03/04/18 2125)  sodium chloride 0.9 % bolus 1,000 mL (0 mLs Intravenous Stopped 03/04/18 2231)     Initial Impression / Assessment and Plan / ED Course  I have reviewed the triage vital signs and the nursing  notes.  Pertinent labs & imaging results that were available during my care of the patient were reviewed by me and considered in my medical decision making (see chart for details).  Clinical Course as of Mar 04 2313  Wynelle LinkSun Mar 04, 2018  2259 Reevaluated patient.  She is awake, alert, interacting with her friends at bedside.  Vital signs stable.  Discussed benign work-up so far.   [CG]    Clinical Course User Index [CG] Liberty HandyGibbons, Jakell Trusty J, PA-C    22 year old female with history of seizures, medical noncompliance with Keppra is here for witnessed seizure-like activity in setting of unknown EtOH and marijuana use earlier today.  She arrives drowsy to the ER but otherwise grossly neurologically intact.  No witnessed seizure en route by EMS. No tongue injury or incontinence.  No signs of head trauma,  meningismus.    Work-up today is unremarkable.  Electrolytes WNL.  Vital signs WNL.  Considered obtaining a CT head however she has long history of seizures presenting similarly in the setting of alcohol use, post seizure headaches and medical noncompliance.  She had 2 CT scans of the head in 2017 that were normal.  I have low suspicion for acute intracranial vascular pathology, CVA, bleed. No indication for lumbar puncture given exam, symptomatology unlikely meningitis.  She denies ETOH withdrawal type seizures.  She was given IV fluids, Keppra and observed without further breakthrough seizure-like activity in the ER.  She is ambulatory, tolerating fluids and interactive with her friends.  She is considered safe for discharge with neurology follow-up and prescription for Keppra.  Discussed return precautions.  Final Clinical Impressions(s) / ED Diagnoses   Final diagnoses:  Breakthrough seizure [facility](HCC)  Medical non-compliance    ED Discharge Orders         Ordered    levETIRAcetam (KEPPRA) 500 MG tablet  2 times daily     03/04/18 2314           Jerrell MylarGibbons, Storie Heffern J, PA-C 03/04/18 2314     Gerhard MunchLockwood, Robert, MD 03/05/18 0010
